# Patient Record
Sex: Female | Born: 1960 | Race: White | Hispanic: No | Marital: Married | Smoking: Never smoker
Health system: Southern US, Community
[De-identification: ages and names within clinical notes are randomized; demographics above are authoritative.]

## PROBLEM LIST (undated history)

## (undated) DIAGNOSIS — M069 Rheumatoid arthritis, unspecified: Secondary | ICD-10-CM

## (undated) DIAGNOSIS — M79671 Pain in right foot: Secondary | ICD-10-CM

## (undated) DIAGNOSIS — R7303 Prediabetes: Secondary | ICD-10-CM

## (undated) DIAGNOSIS — T7840XA Allergy, unspecified, initial encounter: Secondary | ICD-10-CM

## (undated) DIAGNOSIS — I10 Essential (primary) hypertension: Secondary | ICD-10-CM

## (undated) DIAGNOSIS — M79672 Pain in left foot: Secondary | ICD-10-CM

## (undated) DIAGNOSIS — E7800 Pure hypercholesterolemia, unspecified: Secondary | ICD-10-CM

## (undated) HISTORY — PX: REPAIR OF COMPLEX TRACTION RETINAL DETACHMENT: SHX6217

## (undated) HISTORY — DX: Rheumatoid arthritis, unspecified: M06.9

## (undated) HISTORY — DX: Essential (primary) hypertension: I10

## (undated) HISTORY — DX: Pain in right foot: M79.671

## (undated) HISTORY — DX: Prediabetes: R73.03

## (undated) HISTORY — DX: Pain in right foot: M79.672

## (undated) HISTORY — DX: Allergy, unspecified, initial encounter: T78.40XA

## (undated) HISTORY — DX: Pure hypercholesterolemia, unspecified: E78.00

---

## 2012-03-11 HISTORY — PX: COLONOSCOPY: SHX174

## 2012-12-12 ENCOUNTER — Encounter: Payer: Self-pay | Admitting: Obstetrics and Gynecology

## 2012-12-12 DIAGNOSIS — I10 Essential (primary) hypertension: Secondary | ICD-10-CM | POA: Insufficient documentation

## 2012-12-12 DIAGNOSIS — E7800 Pure hypercholesterolemia, unspecified: Secondary | ICD-10-CM | POA: Insufficient documentation

## 2012-12-13 ENCOUNTER — Ambulatory Visit (INDEPENDENT_AMBULATORY_CARE_PROVIDER_SITE_OTHER): Payer: BC Managed Care – PPO | Admitting: Obstetrics and Gynecology

## 2012-12-13 ENCOUNTER — Encounter: Payer: Self-pay | Admitting: Obstetrics and Gynecology

## 2012-12-13 VITALS — BP 138/80 | HR 82 | Resp 20 | Ht 64.5 in | Wt 188.0 lb

## 2012-12-13 DIAGNOSIS — Z01419 Encounter for gynecological examination (general) (routine) without abnormal findings: Secondary | ICD-10-CM

## 2012-12-13 NOTE — Patient Instructions (Signed)

## 2012-12-13 NOTE — Progress Notes (Signed)
52 y.o.   Married    Caucasian   female   G0P0   here for annual exam.  Frustrated with foot pain and having trouble with achilles tendon.  No menses since Dec 2013, then started July 25th, a regular period.    Patient's last menstrual period was 12/02/2012.          Sexually active: yes  The current method of family planning is none.    Exercising: not now( foot problems) Last mammogram:  01/13/2012 neg Last pap smear: 11/15/10 neg History of abnormal pap: no Smoking: no Alcohol: no Last colonoscopy:03/2012 (2) polyps, repeat in 3 years Last Bone Density:  never Last tetanus shot:2012 Last cholesterol check: 2013 total 201  Hgb:    pcp            Urine: pcp   Family History  Problem Relation Age of Onset  . Diabetes Mother   . Hypertension Mother   . Depression Mother   . Diabetes Father   . Multiple myeloma Father     Patient Active Problem List   Diagnosis Date Noted  . Hypertension 12/12/2012  . Elevated cholesterol 12/12/2012    Past Medical History  Diagnosis Date  . Hypertension   . Hypercholesteremia   . Foot pain, bilateral     Past Surgical History  Procedure Laterality Date  . Colonoscopy  03/2012    (2) polyps    Allergies: Zithromax  Current Outpatient Prescriptions  Medication Sig Dispense Refill  . Cholecalciferol (VITAMIN D) 2000 UNITS tablet Take 2,000 Units by mouth daily.      . Cinnamon 500 MG capsule Take 500 mg by mouth 2 (two) times daily.      . magnesium gluconate (MAGONATE) 500 MG tablet Take 500 mg by mouth daily.      . Omega-3 Fatty Acids (OMEGA-3 FISH OIL PO) Take by mouth.      . Plant Sterols and Stanols 450 MG TABS Take by mouth 2 (two) times daily.       No current facility-administered medications for this visit.    ROS: Pertinent items are noted in HPI.  Social Hx:  Married, no children, not employed  Exam:    BP 138/80  Pulse 82  Resp 20  Ht 5' 4.5" (1.638 m)  Wt 188 lb (85.276 kg)  BMI 31.78 kg/m2  LMP  07/25/2014Wt up 8 pounds, ht stable from last year   Wt Readings from Last 3 Encounters:  12/13/12 188 lb (85.276 kg)     Ht Readings from Last 3 Encounters:  12/13/12 5' 4.5" (1.638 m)    General appearance: alert, cooperative and appears stated age Head: Normocephalic, without obvious abnormality, atraumatic Neck: no adenopathy, supple, symmetrical, trachea midline and thyroid not enlarged, symmetric, no tenderness/mass/nodules Lungs: clear to auscultation bilaterally Breasts: Inspection negative, No nipple retraction or dimpling, No nipple discharge or bleeding, No axillary or supraclavicular adenopathy, Normal to palpation without dominant masses Heart: regular rate and rhythm Abdomen: soft, non-tender; bowel sounds normal; no masses,  no organomegaly Extremities: extremities normal, atraumatic, no cyanosis or edema Skin: Skin color, texture, turgor normal. No rashes or lesions Lymph nodes: Cervical, supraclavicular, and axillary nodes normal. No abnormal inguinal nodes palpated Neurologic: Grossly normal   Pelvic: External genitalia:  no lesions              Urethra:  normal appearing urethra with no masses, tenderness or lesions  Bartholins and Skenes: normal                 Vagina: normal appearing vagina with normal color and discharge, no lesions              Cervix: normal appearance              Pap taken: no        Bimanual Exam:  Uterus:  uterus is normal size, shape, consistency and nontender                                      Adnexa: normal adnexa in size, nontender and no masses                                      Rectovaginal: Confirms                                      Anus:  normal sphincter tone, no lesions  A: normal perimenopausal exam, natural family planning     HTN     P:     mammogram counseled on breast self exam, mammography screening, adequate intake of calcium and vitamin D, diet and exercise return annually or prn     An After  Visit Summary was printed and given to the patient.

## 2013-02-23 DIAGNOSIS — M064 Inflammatory polyarthropathy: Secondary | ICD-10-CM | POA: Insufficient documentation

## 2013-02-23 DIAGNOSIS — M1990 Unspecified osteoarthritis, unspecified site: Secondary | ICD-10-CM | POA: Insufficient documentation

## 2013-02-23 DIAGNOSIS — M069 Rheumatoid arthritis, unspecified: Secondary | ICD-10-CM | POA: Insufficient documentation

## 2013-03-09 DIAGNOSIS — R768 Other specified abnormal immunological findings in serum: Secondary | ICD-10-CM

## 2013-03-09 HISTORY — DX: Other specified abnormal immunological findings in serum: R76.8

## 2013-12-14 ENCOUNTER — Encounter: Payer: Self-pay | Admitting: Obstetrics and Gynecology

## 2013-12-14 ENCOUNTER — Ambulatory Visit (INDEPENDENT_AMBULATORY_CARE_PROVIDER_SITE_OTHER): Payer: BC Managed Care – PPO | Admitting: Obstetrics and Gynecology

## 2013-12-14 VITALS — BP 130/88 | HR 80 | Resp 16 | Ht 64.5 in | Wt 184.2 lb

## 2013-12-14 DIAGNOSIS — Z01419 Encounter for gynecological examination (general) (routine) without abnormal findings: Secondary | ICD-10-CM

## 2013-12-14 DIAGNOSIS — D229 Melanocytic nevi, unspecified: Secondary | ICD-10-CM

## 2013-12-14 DIAGNOSIS — D239 Other benign neoplasm of skin, unspecified: Secondary | ICD-10-CM

## 2013-12-14 NOTE — Progress Notes (Signed)
Patient ID: Julie [name], female   DOB: 1961/01/19, 53 y.o.   MRN: [id number] GYNECOLOGY VISIT  PCP:   Horald Pollen, MD  Referring provider:   HPI: 53 y.o.   Married  Caucasian  female   G0P0 with Patient's last menstrual period was 12/02/2012.   here for  AEX.  Menses stopped one year ago.  Menopausal symptoms have decreased.  No vaginal dryness.   Has rheumatoid arthritis. Sees Adriana Simas.   Hgb:    PCP Urine:  PCP  GYNECOLOGIC HISTORY: Patient's last menstrual period was 12/02/2012. Sexually active:  yes Partner preference: female Contraception:  postmenopausal  Menopausal hormone therapy: no DES exposure:  no  Blood transfusions:  no  Sexually transmitted diseases:  no  GYN procedures and prior surgeries:  no Last mammogram:  02-01-13 [id number]              Last pap and high risk HPV testing:  11-15-10 wnl  History of abnormal pap smear:  no   OB History   Grav Para Term Preterm Abortions TAB SAB Ect Mult Living   0 0               LIFESTYLE: Exercise:  yoga             Tobacco: no Alcohol:  no Drug use:  no  OTHER HEALTH MAINTENANCE: Tetanus/TDap:  2012 Gardisil:            n/a Influenza:        02/2013 Zostavax:     n/a  Bone density:   --- Colonoscopy:   03/2012 revealed 2 polyps with Dr. Tora Duck in [place].  Next colonoscopy due 03/2015.  Cholesterol check: 201 with PCP  Family History  Problem Relation Age of Onset  . Diabetes Mother   . Hypertension Mother   . Depression Mother   . Diabetes Father   . Multiple myeloma Father     Patient Active Problem List   Diagnosis Date Noted  . Hypertension 12/12/2012  . Elevated cholesterol 12/12/2012   Past Medical History  Diagnosis Date  . Hypertension   . Hypercholesteremia   . Foot pain, bilateral   . Rheumatoid arthritis     Past Surgical History  Procedure Laterality Date  . Colonoscopy  03/2012    (2) polyps     ALLERGIES: Zithromax  Current Outpatient Prescriptions  Medication Sig Dispense Refill  . Alpha-Lipoic Acid 600 MG CAPS Take 600 mg by mouth 2 (two) times daily.      . Ascorbic Acid (VITAMIN C) 1000 MG tablet Take 1,000 mg by mouth 2 (two) times daily.      Marland Kitchen aspirin EC 81 MG tablet Take 81 mg by mouth daily.      . beta carotene 25000 UNIT capsule Take 25,000 Units by mouth daily.      . Cholecalciferol (VITAMIN D3) 1000 UNITS CAPS Take 1,000 Units by mouth daily. Takes 6000 units daily      . diclofenac (VOLTAREN) 75 MG EC tablet Take 75 mg by mouth 2 (two) times daily.      . hydroxychloroquine (PLAQUENIL) 200 MG tablet Take 200 mg by mouth daily. TAKE ONE TABLET BY MOUTH  DAILY      . lactobacillus acidophilus (BACID) TABS tablet Take 2 tablets by mouth daily.      Marland Kitchen liothyronine (CYTOMEL) 5 MCG tablet Take 5 mcg by mouth 2 (two) times daily.      Marland Kitchen  lisinopril (PRINIVIL,ZESTRIL) 5 MG tablet Take 5 mg by mouth.      Marland Kitchen MAGNESIUM GLYCINATE PLUS PO Take 200 mg by mouth 2 (two) times daily.      . Methylcobalamin (METHYL B-12 PO) Take 5,000 mcg by mouth daily.      . minocycline (MINOCIN,DYNACIN) 50 MG capsule Take 50 mg by mouth daily. Takes 114m Mon, Wed, Fri      . Omega-3 Fatty Acids (OMEGA-3 FISH OIL PO) Take by mouth.      . Psyllium (FIBER) 0.52 G CAPS Take 1 packet by mouth daily.       No current facility-administered medications for this visit.     ROS:  Pertinent items are noted in HPI.  SOCIAL HISTORY:  homemaker  PHYSICAL EXAMINATION:    BP 130/88  Pulse 80  Resp 16  Ht 5' 4.5" (1.638 m)  Wt 184 lb 3.2 oz (83.553 kg)  BMI 31.14 kg/m2  LMP 12/02/2012   Wt Readings from Last 3 Encounters:  12/14/13 184 lb 3.2 oz (83.553 kg)  12/13/12 188 lb (85.276 kg)     Ht Readings from Last 3 Encounters:  12/14/13 5' 4.5" (1.638 m)  12/13/12 5' 4.5" (1.638 m)    General appearance: alert, cooperative and appears stated age Head: Normocephalic, without obvious  abnormality, atraumatic Neck: no adenopathy, supple, symmetrical, trachea midline and thyroid not enlarged, symmetric, no tenderness/mass/nodules Lungs: clear to auscultation bilaterally Breasts: Inspection negative, No nipple retraction or dimpling, No nipple discharge or bleeding, No axillary or supraclavicular adenopathy, Normal to palpation without dominant masses Heart: regular rate and rhythm Abdomen: soft, non-tender; no masses,  no organomegaly Extremities: extremities normal, atraumatic, no cyanosis or edema Skin: Skin color, texture, turgor normal. No rashes.  Multiple darkly pigmented nevi. (States no change.) Lymph nodes: Cervical, supraclavicular, and axillary nodes normal. No abnormal inguinal nodes palpated Neurologic: Grossly normal  Pelvic: External genitalia:  no lesions              Urethra:  normal appearing urethra with no masses, tenderness or lesions              Bartholins and Skenes: normal                 Vagina: normal appearing vagina with normal color and discharge, no lesions              Cervix: normal appearance              Pap and high risk HPV testing done: Yes.  .            Bimanual Exam:  Uterus:  uterus is normal size, shape, consistency and nontender                                      Adnexa: normal adnexa in size, nontender and no masses                                      Rectovaginal: Confirms                                      Anus:  normal sphincter tone, left perianal 3 mm raised and nonpigmented skin - skin tag.  ASSESSMENT  Normal gynecologic exam. Rheumatoid arthritis.  Darkly pigmented nevi.   PLAN  Mammogram recommended yearly.  Pap smear and high risk HPV testing performed. Counseled on self breast exam, Calcium and vitamin D intake, exercise. I recommend a dermatology visit.  I gave the patient several names and she will consider them and make an appointment.  Return annually or prn   An After Visit Summary was printed and  given to the patient.

## 2013-12-14 NOTE — Patient Instructions (Signed)

## 2013-12-15 DIAGNOSIS — D229 Melanocytic nevi, unspecified: Secondary | ICD-10-CM | POA: Insufficient documentation

## 2013-12-19 LAB — IPS PAP TEST WITH HPV

## 2013-12-27 ENCOUNTER — Encounter: Payer: Self-pay | Admitting: Family Medicine

## 2013-12-27 ENCOUNTER — Ambulatory Visit (INDEPENDENT_AMBULATORY_CARE_PROVIDER_SITE_OTHER): Payer: BC Managed Care – PPO | Admitting: Family Medicine

## 2013-12-27 ENCOUNTER — Other Ambulatory Visit (INDEPENDENT_AMBULATORY_CARE_PROVIDER_SITE_OTHER): Payer: BC Managed Care – PPO

## 2013-12-27 VITALS — BP 130/84 | HR 77 | Ht 64.5 in | Wt 185.0 lb

## 2013-12-27 DIAGNOSIS — M25569 Pain in unspecified knee: Secondary | ICD-10-CM

## 2013-12-27 DIAGNOSIS — M6580 Other synovitis and tenosynovitis, unspecified site: Secondary | ICD-10-CM

## 2013-12-27 DIAGNOSIS — M25562 Pain in left knee: Secondary | ICD-10-CM

## 2013-12-27 DIAGNOSIS — M659 Synovitis and tenosynovitis, unspecified: Secondary | ICD-10-CM | POA: Insufficient documentation

## 2013-12-27 NOTE — Progress Notes (Signed)
[facility] [place], [place] [address] Phone: [phone] Subjective:    I'm seeing this patient by the request  of:  Vaughn M.D.   CC: Left knee pain [id number] Julie [name] is a 53 y.o. female coming in with complaint of left knee pain. Patient states that she has had this pain for multiple months but seems to be intermittent. Patient states with a lot of standing as well as with a lot of sitting she can have some discomfort that's mostly in the posterior aspect of the knee. Denies any radiation down her legs or any numbness. Patient has tried some over-the-counter medications with minimal benefit. Patient is also taking diclofenac on a regular basis with no significant improvement. Patient does have a past medical history significant for rheumatoid arthritis and feels that this is contributing to the pain. Patient states that she is no longer walking but this is due to her knee pain as well as bilateral foot pain. Her knee does not wake her up at night. States that the severity is 4/10     Past medical history, social, surgical and family history all reviewed in electronic medical record.   Review of Systems: No headache, visual changes, nausea, vomiting, diarrhea, constipation, dizziness, abdominal pain, skin rash, fevers, chills, night sweats, weight loss, swollen lymph nodes, body aches, joint swelling, muscle aches, chest pain, shortness of breath, mood changes.   Objective Blood pressure 130/84, pulse 77, height 5' 4.5" (1.638 m), weight 185 lb (83.915 kg), last menstrual period 12/02/2012, SpO2 96.00%.  General: No apparent distress alert and oriented x3 mood and affect normal, dressed appropriately.  HEENT: Pupils equal, extraocular movements intact  Respiratory: Patient's speak in full sentences and does not appear short of breath  Cardiovascular: No lower extremity edema, non tender, no erythema  Skin: Warm dry intact with no  signs of infection or rash on extremities or on axial skeleton.  Abdomen: Soft nontender  Neuro: Cranial nerves II through XII are intact, neurovascularly intact in all extremities with 2+ DTRs and 2+ pulses.  Lymph: No lymphadenopathy of posterior or anterior cervical chain or axillae bilaterally.  Gait normal with good balance and coordination.  MSK:  Non tender with full range of motion and good stability and symmetric strength and tone of shoulders, elbows, wrist, hip, and ankles bilaterally.  Knee: Left Normal to inspection with no erythema or effusion or obvious bony abnormalities. Mild fullness noted in the popliteal fossa Minimal tenderness to palpation over the medial joint line ROM shows patient lacks the last 2 of extension as well as flexion Ligaments with solid consistent endpoints including ACL, PCL, LCL, MCL. Positive Mcmurray's, Apley's, and Thessalonian tests. Non painful patellar compression. Patellar glide with moderate crepitus. Patellar and quadriceps tendons unremarkable. Hamstring and quadriceps strength is normal.   MSK US performed of: Left knee This study was ordered, performed, and interpreted by Charlann Boxer D.O.  Knee: All structures visualized. Patient suprapatellar pouch do show that patient does have a fairly severe synovitis with mild effusion. Rheumatoid nodule noted. Anteromedial meniscus does have some degenerative changes but no acute tear noted. No displacement noted. Anterolateral, posteromedial, and posterolateral menisci unremarkable without tearing, fraying, effusion, or displacement. Patellar Tendon unremarkable on long and transverse views without effusion. No abnormality of prepatellar bursa. LCL and MCL unremarkable on long and transverse views. No abnormality of origin of medial or lateral head of the gastrocnemius. Baker cyst noted but mostly coagulated at this time.  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

## 2013-12-27 NOTE — Assessment & Plan Note (Signed)
Patient does have synovitis as well as a Baker's cyst that's mostly made synovial material. We discussed different treatment options at this time the patient was at significantly against a steroid injection. Patient was given a brace was fitted by me as well as a compression sleeve that I think will be beneficial. We discussed icing as well as home exercise program. We discussed the possibility of responding to certain over-the-counter medications. Patient will try a topical anti-inflammatory and can cause if she enjoys or feels that is helping. Patient and will come back again in 3-4 weeks for further evaluation and treatment.

## 2013-12-27 NOTE — Patient Instructions (Signed)
Good to meet you Ice 20 minutes 2 times daily. Usually after activity and before bed. Exercises 3 times a week.  Wear brace with a lot of activity and compression with a lot of sitting.  Shoes- rigid sole shoes. Bronwen Betters, Dansko and New balance greater then 700 Would recommend the vitamin D turmeric and consider glucosamine.  Come back in 4-6 weeks to see how you are doing.

## 2014-02-07 ENCOUNTER — Encounter: Payer: Self-pay | Admitting: Family Medicine

## 2014-02-07 ENCOUNTER — Ambulatory Visit (INDEPENDENT_AMBULATORY_CARE_PROVIDER_SITE_OTHER): Payer: BC Managed Care – PPO | Admitting: Family Medicine

## 2014-02-07 VITALS — BP 170/110 | HR 72 | Temp 98.4°F | Wt 178.0 lb

## 2014-02-07 DIAGNOSIS — M6580 Other synovitis and tenosynovitis, unspecified site: Secondary | ICD-10-CM

## 2014-02-07 DIAGNOSIS — M659 Synovitis and tenosynovitis, unspecified: Secondary | ICD-10-CM

## 2014-02-07 MED ORDER — DICLOFENAC SODIUM 2 % TD SOLN: 2.0000 "application " | Freq: Two times a day (BID) | TRANSDERMAL | Status: DC

## 2014-02-07 NOTE — Progress Notes (Signed)
  [facility] [place],  [address] Phone: [phone] Subjective:     CC: Left knee pain follow up [id number] Julie [name] is a 53 y.o. female coming in with complaint of left knee pain. Patient was found to have a synovitis. Patient does have a past medical history significant for rheumatoid arthritis. Patient elected to try conservative therapy with over-the-counter medications as well as naproxen on an as-needed basis. Patient continues to wear the brace as well as do the exercises fairly regularly. Patient states that she would say she is approximately 80-85% better. Denies any radiation down the legs any numbness or tingling. Overall think she is doing fairly well. Still has some days when they can give her some discomfort but denies any locking or giving out on her.     Past medical history, social, surgical and family history all reviewed in electronic medical record.   Review of Systems: No headache, visual changes, nausea, vomiting, diarrhea, constipation, dizziness, abdominal pain, skin rash, fevers, chills, night sweats, weight loss, swollen lymph nodes, body aches, joint swelling, muscle aches, chest pain, shortness of breath, mood changes.   Objective Blood pressure 170/110, pulse 72, temperature 98.4 F (36.9 C), temperature source Oral, weight 178 lb (80.74 kg), SpO2 98.00%.  General: No apparent distress alert and oriented x3 mood and affect normal, dressed appropriately.  HEENT: Pupils equal, extraocular movements intact  Respiratory: Patient's speak in full sentences and does not appear short of breath  Cardiovascular: No lower extremity edema, non tender, no erythema  Skin: Warm dry intact with no signs of infection or rash on extremities or on axial skeleton.  Abdomen: Soft nontender  Neuro: Cranial nerves II through XII are intact, neurovascularly intact in all extremities with 2+ DTRs and 2+ pulses.  Lymph: No  lymphadenopathy of posterior or anterior cervical chain or axillae bilaterally.  Gait normal with good balance and coordination.  MSK:  Non tender with full range of motion and good stability and symmetric strength and tone of shoulders, elbows, wrist, hip, and ankles bilaterally.  Knee: Left Normal to inspection with no erythema or effusion or obvious bony abnormalities. Less swelling noted than previous exam in the popliteal fossa Minimal tenderness to palpation over the medial joint line Near complete range of motion Ligaments with solid consistent endpoints including ACL, PCL, LCL, MCL. Positive Mcmurray's, Apley's, and Thessalonian tests. Non painful patellar compression. Patellar glide with moderate crepitus. Patellar and quadriceps tendons unremarkable. Hamstring and quadriceps strength is normal.   MSK US performed of: Left knee This study was ordered, performed, and interpreted by Charlann Boxer D.O.  Knee: All structures visualized. Mild synovitis still noted Anteromedial meniscus degenerative changes No displacement noted. Anterolateral, posteromedial, and posterolateral menisci unremarkable without tearing, fraying, effusion, or displacement. Patellar Tendon unremarkable on long and transverse views without effusion. No abnormality of prepatellar bursa. LCL and MCL unremarkable on long and transverse views. No abnormality of origin of medial or lateral head of the gastrocnemius. Baker cyst noted but mostly coagulated at this time.  IMPRESSION: Synovitis with improvement and continued Baker cyst      Impression and Recommendations:     This case required medical decision making of moderate complexity.

## 2014-02-07 NOTE — Patient Instructions (Addendum)
You are doing amazing.  Continue to decrease the brace when you want.  Ice is your friend when you need it.,  Try the pennsaid twice daily on the knee and any other place that aches. Call them if you have not heard in a week.  Continue the exercises 2-3 times a week.  See me again in 6 weeks if not perfect.

## 2014-02-07 NOTE — Assessment & Plan Note (Signed)
Patient has made some improvement this with the over-the-counter medications. Patient was given a trial of topical anti-inflammatories and will see if this will be beneficial. Encourage patient to continue to wear the brace and when needed. Discussed icing protocol. I do feel this is likely secondary to patient's rheumatoid arthritis we may need to consider further chronic treatment options. Patient to will continue to do what she is doing because she states that she is having subjective improvement. Patient will follow up with me again in 4-6 weeks for further evaluation.  Spent greater than 25 minutes with patient face-to-face and had greater than 50% of counseling including as described above in assessment and plan.

## 2014-02-07 NOTE — Progress Notes (Signed)
Pre visit review using our clinic review tool, if applicable. No additional management support is needed unless otherwise documented below in the visit note. 

## 2014-03-21 ENCOUNTER — Ambulatory Visit: Payer: BC Managed Care – PPO | Admitting: Family Medicine

## 2014-12-21 ENCOUNTER — Ambulatory Visit: Payer: BC Managed Care – PPO | Admitting: Obstetrics and Gynecology

## 2014-12-26 ENCOUNTER — Encounter: Payer: Self-pay | Admitting: Obstetrics and Gynecology

## 2014-12-26 ENCOUNTER — Ambulatory Visit (INDEPENDENT_AMBULATORY_CARE_PROVIDER_SITE_OTHER): Payer: BLUE CROSS/BLUE SHIELD | Admitting: Obstetrics and Gynecology

## 2014-12-26 VITALS — BP 136/84 | HR 76 | Resp 18 | Ht 64.25 in | Wt 169.6 lb

## 2014-12-26 DIAGNOSIS — Z0000 Encounter for general adult medical examination without abnormal findings: Secondary | ICD-10-CM

## 2014-12-26 DIAGNOSIS — Z01419 Encounter for gynecological examination (general) (routine) without abnormal findings: Secondary | ICD-10-CM

## 2014-12-26 LAB — POCT URINALYSIS DIPSTICK
BILIRUBIN UA: NEGATIVE
Blood, UA: NEGATIVE
Glucose, UA: NEGATIVE
Ketones, UA: NEGATIVE
Leukocytes, UA: NEGATIVE
NITRITE UA: NEGATIVE
PH UA: 5
Protein, UA: NEGATIVE
Urobilinogen, UA: NEGATIVE

## 2014-12-26 NOTE — Progress Notes (Signed)
Patient ID: Julie [name], female   DOB: [birth year], 54 y.o.   MRN: [id number] 54 y.o. G0P0 Married Caucasian female here for annual exam.    Doing OK with menopause.  Hot flashes are random but manageable. Some increase in symptoms this summer with the heat.  Declines any therapy.   Denies any vaginal bleeding or  spotting.   Rheumatoid arthritis is under good control.   Did not see dermatology last year. Has had moles removed in the past which "were not anything" per patient.  Does not remember name of provider.   PCP:  Lollie Sails, MD   Patient's last menstrual period was 12/02/2012 (approximate).          Sexually active: Yes.   female The current method of family planning is post menopausal status.    Exercising: Yes.    walk 2.22mles 4 times/week, yoga twice weekly. Smoker:  no  Health Maintenance: Pap:  12-14-13 Neg:Neg HR HPV History of abnormal Pap:  no MMG:  02-05-14 [facility] poly[facility] with Dr WTora Duckin [place]  Due this year.  Patient will call to schedule. BMD:   n/a  Result  n/a TDaP:  2012 Screening Labs:  Hb today: PCP, Urine today: Neg   reports that she has never smoked. She has never used smokeless tobacco. She reports that she does not drink alcohol or use illicit drugs.  Past Medical History  Diagnosis Date  . Hypertension   . Hypercholesteremia   . Foot pain, bilateral   . Rheumatoid arthritis     Past Surgical History  Procedure Laterality Date  . Colonoscopy  03/2012    (2) polyps    Current Outpatient Prescriptions  Medication Sig Dispense Refill  . Alpha-Lipoic Acid 600 MG CAPS Take 600 mg by mouth 2 (two) times daily.    . Ascorbic Acid (VITAMIN C) 1000 MG tablet Take 1,000 mg by mouth 2 (two) times daily.    . beta carotene 25000 UNIT capsule Take 25,000 Units by mouth daily.    . Cholecalciferol (VITAMIN D3) 1000 UNITS CAPS Take 1,000 Units by mouth daily.  Takes 6000 units daily    . diclofenac (VOLTAREN) 75 MG EC tablet Take 1 tablet by mouth. Takes 1 tablet every 3-5 days  0  . lactobacillus acidophilus (BACID) TABS tablet Take 2 tablets by mouth daily.    .Marland Kitchenliothyronine (CYTOMEL) 5 MCG tablet Take 5 mcg by mouth 2 (two) times daily. 20 mcg in the AM and 10 mcg in the PM    . MAGNESIUM GLYCINATE PLUS PO Take 200 mg by mouth 2 (two) times daily.    . Methylcobalamin (METHYL B-12 PO) Take 5,000 mcg by mouth daily.    .Marland KitchenMILK THISTLE PO Take 600 mg by mouth daily.    . minocycline (MINOCIN,DYNACIN) 50 MG capsule Take 50 mg by mouth daily. Takes 1066mMon, Wed, Fri    . Omega-3 Fatty Acids (OMEGA-3 FISH OIL PO) Take by mouth.    . Psyllium (FIBER) 0.52 G CAPS Take 1 packet by mouth daily.     No current facility-administered medications for this visit.    Family History  Problem Relation Age of Onset  . Diabetes Mother   . Hypertension Mother   . Depression Mother   . Diabetes Father   . Multiple myeloma Father     ROS:  Pertinent items are noted in HPI.  Otherwise, a comprehensive ROS was negative.  Exam:  BP 136/84 mmHg  Pulse 76  Resp 18  Ht 5' 4.25" (1.632 m)  Wt 169 lb 9.6 oz (76.93 kg)  BMI 28.88 kg/m2  LMP 12/02/2012 (Approximate)    General appearance: alert, cooperative and appears stated age Head: Normocephalic, without obvious abnormality, atraumatic Neck: no adenopathy, supple, symmetrical, trachea midline and thyroid normal to inspection and palpation Lungs: clear to auscultation bilaterally Breasts: normal appearance, no masses or tenderness, Inspection negative, No nipple retraction or dimpling, No nipple discharge or bleeding, No axillary or supraclavicular adenopathy Heart: regular rate and rhythm Abdomen: soft, non-tender; bowel sounds normal; no masses,  no organomegaly Extremities: extremities normal, atraumatic, no cyanosis or edema Skin: Skin color, texture, turgor normal. Multiple pigmented nevi.  4 mm dark  brownish black area of right mid abdomen.   Lymph nodes: Cervical, supraclavicular, and axillary nodes normal. No abnormal inguinal nodes palpated Neurologic: Grossly normal  Pelvic: External genitalia:  no lesions              Urethra:  normal appearing urethra with no masses, tenderness or lesions              Bartholins and Skenes: normal                 Vagina: normal appearing vagina with normal color and discharge, no lesions              Cervix: 4 mm area of erythema of cervix at 10:00.  Pap taken here.  Brush also used on cervical os which is stenotic.  (has had recent intercourse).              Pap taken: Yes.   Bimanual Exam:  Uterus:  normal size, contour, position, consistency, mobility, non-tender              Adnexa: normal adnexa and no mass, fullness, tenderness              Rectovaginal: Yes.  .  Confirms.              Anus:  normal sphincter tone, no lesions  Chaperone was present for exam.  Assessment:   Well woman visit with normal exam. Pigmented nevus.  Atrophy of vagina.  Area of cervix is probably atrophy change as a result of intercourse.   Plan: Yearly mammogram recommended after age 37.  She will schedule. Recommended self breast exam.  Pap and HR HPV as above. Discussed Calcium, Vitamin D, regular exercise program including cardiovascular and weight bearing exercise. Labs performed.  No..  Refills given on medications.  No..    Discussed vaginal atrophy and options for treatment - water based, cooking oils, vaginal estrogens.  No Rx at this time.  Patient states she will take responsibility for making an appointment with dermatologist.  She wants to choose her provider.  She understands I would like this appointment to rule out dysplasia or cancer.  She will call back if she cannot get an appointment.  Follow up annually and prn.     After visit summary provided.

## 2014-12-26 NOTE — Patient Instructions (Signed)

## 2014-12-29 LAB — IPS PAP TEST WITH HPV

## 2016-01-09 ENCOUNTER — Ambulatory Visit: Payer: BLUE CROSS/BLUE SHIELD | Admitting: Obstetrics and Gynecology

## 2016-01-10 ENCOUNTER — Ambulatory Visit: Payer: BLUE CROSS/BLUE SHIELD | Admitting: Certified Nurse Midwife

## 2016-01-29 ENCOUNTER — Ambulatory Visit (INDEPENDENT_AMBULATORY_CARE_PROVIDER_SITE_OTHER): Payer: BLUE CROSS/BLUE SHIELD | Admitting: Certified Nurse Midwife

## 2016-01-29 ENCOUNTER — Encounter: Payer: Self-pay | Admitting: Certified Nurse Midwife

## 2016-01-29 VITALS — BP 120/78 | HR 72 | Resp 16 | Ht 64.25 in | Wt 184.0 lb

## 2016-01-29 DIAGNOSIS — Z01419 Encounter for gynecological examination (general) (routine) without abnormal findings: Secondary | ICD-10-CM

## 2016-01-29 NOTE — Patient Instructions (Signed)
EXERCISE AND DIET:  We recommended that you start or continue a regular exercise program for good health. Regular exercise means any activity that makes your heart beat faster and makes you sweat.  We recommend exercising at least 30 minutes per day at least 3 days a week, preferably 4 or 5.  We also recommend a diet low in fat and sugar.  Inactivity, poor dietary choices and obesity can cause diabetes, heart attack, stroke, and kidney damage, among others.    ALCOHOL AND SMOKING:  Women should limit their alcohol intake to no more than 7 drinks/beers/glasses of wine (combined, not each!) per week. Moderation of alcohol intake to this level decreases your risk of breast cancer and liver damage. And of course, no recreational drugs are part of a healthy lifestyle.  And absolutely no smoking or even second hand smoke. Most people know smoking can cause heart and lung diseases, but did you know it also contributes to weakening of your bones? Aging of your skin?  Yellowing of your teeth and nails?  CALCIUM AND VITAMIN D:  Adequate intake of calcium and Vitamin D are recommended.  The recommendations for exact amounts of these supplements seem to change often, but generally speaking 600 mg of calcium (either carbonate or citrate) and 800 units of Vitamin D per day seems prudent. Certain women may benefit from higher intake of Vitamin D.  If you are among these women, your doctor will have told you during your visit.    PAP SMEARS:  Pap smears, to check for cervical cancer or precancers,  have traditionally been done yearly, although recent scientific advances have shown that most women can have pap smears less often.  However, every woman still should have a physical exam from her gynecologist every year. It will include a breast check, inspection of the vulva and vagina to check for abnormal growths or skin changes, a visual exam of the cervix, and then an exam to evaluate the size and shape of the uterus and  ovaries.  And after 57 years of age, a rectal exam is indicated to check for rectal cancers. We will also provide age appropriate advice regarding health maintenance, like when you should have certain vaccines, screening for sexually transmitted diseases, bone density testing, colonoscopy, mammograms, etc.   MAMMOGRAMS:  All women over 40 years old should have a yearly mammogram. Many facilities now offer a "3D" mammogram, which may cost around $50 extra out of pocket. If possible,  we recommend you accept the option to have the 3D mammogram performed.  It both reduces the number of women who will be called back for extra views which then turn out to be normal, and it is better than the routine mammogram at detecting truly abnormal areas.    COLONOSCOPY:  Colonoscopy to screen for colon cancer is recommended for all women at age 50.  We know, you hate the idea of the prep.  We agree, BUT, having colon cancer and not knowing it is worse!!  Colon cancer so often starts as a polyp that can be seen and removed at colonscopy, which can quite literally save your life!  And if your first colonoscopy is normal and you have no family history of colon cancer, most women don't have to have it again for 10 years.  Once every ten years, you can do something that may end up saving your life, right?  We will be happy to help you get it scheduled when you are ready.    Be sure to check your insurance coverage so you understand how much it will cost.  It may be covered as a preventative service at no cost, but you should check your particular policy.      Breast Self-Awareness Practicing breast self-awareness may pick up problems early, prevent significant medical complications, and possibly save your life. By practicing breast self-awareness, you can become familiar with how your breasts look and feel and if your breasts are changing. This allows you to notice changes early. It can also offer you some reassurance that your  breast health is good. One way to learn what is normal for your breasts and whether your breasts are changing is to do a breast self-exam. If you find a lump or something that was not present in the past, it is best to contact your caregiver right away. Other findings that should be evaluated by your caregiver include nipple discharge, especially if it is bloody; skin changes or reddening; areas where the skin seems to be pulled in (retracted); or new lumps and bumps. Breast pain is seldom associated with cancer (malignancy), but should also be evaluated by a caregiver. HOW TO PERFORM A BREAST SELF-EXAM The best time to examine your breasts is 5-7 days after your menstrual period is over. During menstruation, the breasts are lumpier, and it may be more difficult to pick up changes. If you do not menstruate, have reached menopause, or had your uterus removed (hysterectomy), you should examine your breasts at regular intervals, such as monthly. If you are breastfeeding, examine your breasts after a feeding or after using a breast pump. Breast implants do not decrease the risk for lumps or tumors, so continue to perform breast self-exams as recommended. Talk to your caregiver about how to determine the difference between the implant and breast tissue. Also, talk about the amount of pressure you should use during the exam. Over time, you will become more familiar with the variations of your breasts and more comfortable with the exam. A breast self-exam requires you to remove all your clothes above the waist. 1. Look at your breasts and nipples. Stand in front of a mirror in a room with good lighting. With your hands on your hips, push your hands firmly downward. Look for a difference in shape, contour, and size from one breast to the other (asymmetry). Asymmetry includes puckers, dips, or bumps. Also, look for skin changes, such as reddened or scaly areas on the breasts. Look for nipple changes, such as discharge,  dimpling, repositioning, or redness. 2. Carefully feel your breasts. This is best done either in the shower or tub while using soapy water or when flat on your back. Place the arm (on the side of the breast you are examining) above your head. Use the pads (not the fingertips) of your three middle fingers on your opposite hand to feel your breasts. Start in the underarm area and use  inch (2 cm) overlapping circles to feel your breast. Use 3 different levels of pressure (light, medium, and firm pressure) at each circle before moving to the next circle. The light pressure is needed to feel the tissue closest to the skin. The medium pressure will help to feel breast tissue a little deeper, while the firm pressure is needed to feel the tissue close to the ribs. Continue the overlapping circles, moving downward over the breast until you feel your ribs below your breast. Then, move one finger-width towards the center of the body. Continue to use the    inch (2 cm) overlapping circles to feel your breast as you move slowly up toward the collar bone (clavicle) near the base of the neck. Continue the up and down exam using all 3 pressures until you reach the middle of the chest. Do this with each breast, carefully feeling for lumps or changes. 3.  Keep a written record with breast changes or normal findings for each breast. By writing this information down, you do not need to depend only on memory for size, tenderness, or location. Write down where you are in your menstrual cycle, if you are still menstruating. Breast tissue can have some lumps or thick tissue. However, see your caregiver if you find anything that concerns you.  SEEK MEDICAL CARE IF:  You see a change in shape, contour, or size of your breasts or nipples.   You see skin changes, such as reddened or scaly areas on the breasts or nipples.   You have an unusual discharge from your nipples.   You feel a new lump or unusually thick areas.     This information is not intended to replace advice given to you by your health care provider. Make sure you discuss any questions you have with your health care provider.   Document Released: 04/27/2005 Document Revised: 04/13/2012 Document Reviewed: 08/12/2011 Elsevier Interactive Patient Education 2016 [employer]  

## 2016-01-29 NOTE — Progress Notes (Signed)
55 y.o. G0P0 Married  Caucasian Fe here for annual exam. Menopausal no HRT. Denies vaginal bleeding or vaginal dryness. Has RA and stays active. Sees Dr Deirdre Pippins for aex, labs,hypertension,cholesterol and RA management. Occasional hot flash and tolerating well. Taking Boswellia for joint pain.( aware this can contribute to hot flash).Sees rheumatologist prn. Working on [employer] now. No other health issues today. Has 4 cats she is caring for!  Patient's last menstrual period was 12/02/2012 (approximate).          Sexually active: Yes.    The current method of family planning is post menopausal status.    Exercising: Yes.    walking & yoga Smoker:  yes  Health Maintenance: Pap: 12-26-14 neg HPV HR neg MMG: 02-05-14 category a density birads 1:neg Colonoscopy:  2016 polyps f/u 66yr per patient BMD:   none TDaP:  2012 Shingles: no Pneumonia: no Hep C and HIV: not done Labs: pcp Self breast exam: not done   reports that she has never smoked. She has never used smokeless tobacco. She reports that she does not drink alcohol or use drugs.  Past Medical History:  Diagnosis Date  . Foot pain, bilateral   . Hypercholesteremia   . Hypertension   . Rheumatoid arthritis ([facility]     Past Surgical History:  Procedure Laterality Date  . COLONOSCOPY  03/2012   (2) polyps    Current Outpatient Prescriptions  Medication Sig Dispense Refill  . Alpha-Lipoic Acid 600 MG CAPS Take 600 mg by mouth 2 (two) times daily.    . Ascorbic Acid (VITAMIN C PO) Take 2,000 mg by mouth 2 (two) times daily.    .Azucena FreedSerrata (BOSWELLIA PO) Take 800 mg by mouth 2 (two) times daily.    . Cholecalciferol (VITAMIN D PO) Take 10,000 Int'l Units by mouth daily.    . diclofenac (VOLTAREN) 75 MG EC tablet Take 1 tablet by mouth. Takes 1 tablet every 3-5 days  0  . hydroxychloroquine (PLAQUENIL) 200 MG tablet Take by mouth 2 (two) times daily.    . IRON PO Take 50 mg by mouth 2 (two) times daily.    .Marland Kitchen lactobacillus acidophilus (BACID) TABS tablet Take 2 tablets by mouth daily.    .Marland Kitchenliothyronine (CYTOMEL) 5 MCG tablet Take 5 mcg by mouth. 20 mcg in the AM and 15 mcg in the PM    . MAGNESIUM PO Take 400 mg by mouth. 5 days per week    . Methylcobalamin (METHYL B-12 PO) Take 5,000 mcg by mouth every other day.     .Marland KitchenMILK THISTLE PO Take 600 mg by mouth daily.    . minocycline (MINOCIN,DYNACIN) 50 MG capsule Take by mouth. Takes 1034mMon, Wed, Fri    . Omega-3 Fatty Acids (OMEGA-3 FISH OIL PO) Take by mouth.    . Psyllium (FIBER) 0.52 G CAPS Take 1 packet by mouth daily.    . Marland KitchenNABLE TO FIND Curcumin 200026mwice daily     No current facility-administered medications for this visit.     Family History  Problem Relation Age of Onset  . Diabetes Mother   . Hypertension Mother   . Depression Mother   . Diabetes Father   . Multiple myeloma Father     ROS:  Pertinent items are noted in HPI.  Otherwise, a comprehensive ROS was negative.  Exam:   BP 120/78   Pulse 72   Resp 16   Ht 5' 4.25" (1.632 m)   Wt  184 lb (83.5 kg)   LMP 12/02/2012 (Approximate)   BMI 31.34 kg/m  Height: 5' 4.25" (163.2 cm) Ht Readings from Last 3 Encounters:  01/29/16 5' 4.25" (1.632 m)  12/26/14 5' 4.25" (1.632 m)  12/27/13 5' 4.5" (1.638 m)    General appearance: alert, cooperative and appears stated age Head: Normocephalic, without obvious abnormality, atraumatic Neck: no adenopathy, supple, symmetrical, trachea midline and thyroid normal to inspection and palpation Lungs: clear to auscultation bilaterally Breasts: normal appearance, no masses or tenderness, No nipple retraction or dimpling, No nipple discharge or bleeding, No axillary or supraclavicular adenopathy Heart: regular rate and rhythm Abdomen: soft, non-tender; no masses,  no organomegaly Extremities: extremities normal, atraumatic, no cyanosis or edema Skin: Skin color, texture, turgor normal. No rashes or lesions Lymph nodes: Cervical,  supraclavicular, and axillary nodes normal. No abnormal inguinal nodes palpated Neurologic: Grossly normal   Pelvic: External genitalia:  no lesions              Urethra:  normal appearing urethra with no masses, tenderness or lesions              Bartholin's and Skene's: normal                 Vagina: normal appearing vagina with normal color and discharge, no lesions              Cervix: no cervical motion tenderness, no lesions and nulliparous appearance              Pap taken: No. Bimanual Exam:  Uterus:  normal size, contour, position, consistency, mobility, non-tender              Adnexa: normal adnexa and no mass, fullness, tenderness               Rectovaginal: Confirms               Anus:  normal sphincter tone, no lesions  Chaperone present: yes  A:  Well Woman with normal exam  Menopausal no HRT  RA with rheumatology and Dr. Deirdre Pippins management  Hypertension/cholesterol with Dr. Deirdre Pippins management  P:   Reviewed health and wellness pertinent to exam  Aware of need to evaluate if vaginal bleeding  Continue follow up with MD as indicated  Pap smear as above not taken   counseled on breast self exam, mammography screening, adequate intake of calcium and vitamin D, diet and exercise  return annually or prn  An After Visit Summary was printed and given to the patient.

## 2016-02-02 NOTE — Progress Notes (Signed)
Encounter reviewed Jill Jertson, MD   

## 2017-02-03 ENCOUNTER — Encounter: Payer: Self-pay | Admitting: Certified Nurse Midwife

## 2017-02-03 ENCOUNTER — Ambulatory Visit (INDEPENDENT_AMBULATORY_CARE_PROVIDER_SITE_OTHER): Payer: PRIVATE HEALTH INSURANCE | Admitting: Certified Nurse Midwife

## 2017-02-03 VITALS — BP 130/90 | HR 70 | Resp 16 | Ht 64.5 in | Wt 186.0 lb

## 2017-02-03 DIAGNOSIS — Z01419 Encounter for gynecological examination (general) (routine) without abnormal findings: Secondary | ICD-10-CM | POA: Diagnosis not present

## 2017-02-03 DIAGNOSIS — E785 Hyperlipidemia, unspecified: Secondary | ICD-10-CM | POA: Insufficient documentation

## 2017-02-03 DIAGNOSIS — N951 Menopausal and female climacteric states: Secondary | ICD-10-CM

## 2017-02-03 DIAGNOSIS — I10 Essential (primary) hypertension: Secondary | ICD-10-CM | POA: Insufficient documentation

## 2017-02-03 DIAGNOSIS — R7301 Impaired fasting glucose: Secondary | ICD-10-CM | POA: Insufficient documentation

## 2017-02-03 NOTE — Patient Instructions (Signed)

## 2017-02-03 NOTE — Progress Notes (Signed)
56 y.o. G0P0 Married  Caucasian Fe here for annual exam. Menopausal no HRT. Occasional hot flashes only. Denies vaginal bleeding, with occasional vaginal dryness. Has stopped seeing Dr Deirdre Pippins, sees [facility] practice, Raoul Pitch similar to Dr. Deirdre Pippins, with labs. Has appointment 12/18. Rheumatoid arthritis still stable. Does not see Rheumatology at this point. No other health issues today. Planning trip when ever her cats grow old!   Patient's last menstrual period was 12/02/2012 (approximate).          Sexually active: Yes.    The current method of family planning is post menopausal status.    Exercising: Yes.    yoga Smoker:  no  Health Maintenance: Pap:  12-26-14 neg HPV HR neg History of Abnormal Pap: yes MMG:  2017 normal per patient, to sign release Self Breast exams: no Colonoscopy:  2016 polyps f/u 34yr per pt BMD:   none TDaP:  2012 Shingles: no Pneumonia: no Hep C and HIV: not done Labs: no   reports that she has never smoked. She has never used smokeless tobacco. She reports that she does not drink alcohol or use drugs.  Past Medical History:  Diagnosis Date  . Foot pain, bilateral   . Hypercholesteremia   . Hypertension   . Rheumatoid arthritis ([facility]     Past Surgical History:  Procedure Laterality Date  . COLONOSCOPY  03/2012   (2) polyps    Current Outpatient Prescriptions  Medication Sig Dispense Refill  . Alpha-Lipoic Acid 600 MG CAPS Take 600 mg by mouth 2 (two) times daily.    . Ascorbic Acid (VITAMIN C PO) Take 2,000 mg by mouth 2 (two) times daily.    . Cholecalciferol (VITAMIN D PO) Take 10,000 Int'l Units by mouth daily.    . hydroxychloroquine (PLAQUENIL) 200 MG tablet Take by mouth 2 (two) times daily.    .Marland Kitchenlactobacillus acidophilus (BACID) TABS tablet Take 2 tablets by mouth daily.    .Marland Kitchenlevothyroxine (SYNTHROID, LEVOTHROID) 75 MCG tablet     . liothyronine (CYTOMEL) 25 MCG tablet     . MAGNESIUM PO Take 400 mg by mouth. 5 days per week    .  Methylcobalamin (METHYL B-12 PO) Take 5,000 mcg by mouth every other day.     .Marland KitchenMILK THISTLE PO Take 600 mg by mouth daily.    . minocycline (MINOCIN,DYNACIN) 50 MG capsule Take by mouth. Takes 10104mMon, Wed, Fri    . Omega-3 Fatty Acids (OMEGA-3 FISH OIL PO) Take by mouth.    . Marland KitchenNABLE TO FIND Curcumin 200042mwice daily    . UNABLE TO FIND 75 mg daily. pregnenolone     No current facility-administered medications for this visit.     Family History  Problem Relation Age of Onset  . Diabetes Mother   . Hypertension Mother   . Depression Mother   . Diabetes Father   . Multiple myeloma Father     ROS:  Pertinent items are noted in HPI.  Otherwise, a comprehensive ROS was negative.  Exam:   BP 130/90   Pulse 70   Resp 16   Ht 5' 4.5" (1.638 m)   Wt 186 lb (84.4 kg)   LMP 12/02/2012 (Approximate)   BMI 31.43 kg/m  Height: 5' 4.5" (163.8 cm) Ht Readings from Last 3 Encounters:  02/03/17 5' 4.5" (1.638 m)  01/29/16 5' 4.25" (1.632 m)  12/26/14 5' 4.25" (1.632 m)    General appearance: alert, cooperative and appears stated age Head: Normocephalic,  without obvious abnormality, atraumatic Neck: no adenopathy, supple, symmetrical, trachea midline and thyroid normal to inspection and palpation Lungs: clear to auscultation bilaterally Breasts: normal appearance, no masses or tenderness, No nipple retraction or dimpling, No nipple discharge or bleeding, No axillary or supraclavicular adenopathy Heart: regular rate and rhythm Abdomen: soft, non-tender; no masses,  no organomegaly Extremities: extremities normal, atraumatic, no cyanosis or edema Skin: Skin color, texture, turgor normal. No rashes or lesions Lymph nodes: Cervical, supraclavicular, and axillary nodes normal. No abnormal inguinal nodes palpated Neurologic: Grossly normal   Pelvic: External genitalia:  no lesions              Urethra:  normal appearing urethra with no masses, tenderness or lesions               Bartholin's and Skene's: normal                 Vagina: normal appearing vagina with normal color and discharge, no lesions              Cervix: no cervical motion tenderness, no lesions and normal appearance              Pap taken Bimanual Exam:  Uterus:  normal size, contour, position, consistency, mobility, non-tender and anteverted              Adnexa: normal adnexa and no mass, fullness, tenderness               Rectovaginal: Confirms               Anus:  normal sphincter tone, no lesions  Chaperone present: yes  A:  Well Woman with normal exam  Menopausal no HRT  Vaginal dryness  Rheumatoid arthritis stable, no MD follow up at present  P:   Reviewed health and wellness pertinent to exam  Aware of need to advise if vaginal bleeding  Discussed estrogen use and OTC product use, risks/benefits. Will try coconut oil first and advise. Instructions given.  Encouraged to follow up with PCP if not rheumatology  Pap smear: no  counseled on breast self exam, mammography screening, menopause, adequate intake of calcium and vitamin D, diet and exercise  return annually or prn  An After Visit Summary was printed and given to the patient.

## 2017-04-14 DIAGNOSIS — E039 Hypothyroidism, unspecified: Secondary | ICD-10-CM | POA: Insufficient documentation

## 2017-10-17 DIAGNOSIS — E02 Subclinical iodine-deficiency hypothyroidism: Secondary | ICD-10-CM | POA: Insufficient documentation

## 2017-10-17 DIAGNOSIS — Z780 Asymptomatic menopausal state: Secondary | ICD-10-CM | POA: Insufficient documentation

## 2018-02-16 ENCOUNTER — Other Ambulatory Visit (HOSPITAL_COMMUNITY)
Admission: RE | Admit: 2018-02-16 | Discharge: 2018-02-16 | Disposition: A | Discharge status: Home / Self Care | Payer: PRIVATE HEALTH INSURANCE | Source: Ambulatory Visit | Attending: Obstetrics & Gynecology | Admitting: Obstetrics & Gynecology

## 2018-02-16 ENCOUNTER — Ambulatory Visit (INDEPENDENT_AMBULATORY_CARE_PROVIDER_SITE_OTHER): Payer: PRIVATE HEALTH INSURANCE | Admitting: Certified Nurse Midwife

## 2018-02-16 ENCOUNTER — Encounter: Payer: Self-pay | Admitting: Certified Nurse Midwife

## 2018-02-16 ENCOUNTER — Other Ambulatory Visit: Payer: Self-pay

## 2018-02-16 VITALS — BP 130/78 | HR 72 | Resp 16 | Ht 64.25 in | Wt 187.0 lb

## 2018-02-16 DIAGNOSIS — Z124 Encounter for screening for malignant neoplasm of cervix: Secondary | ICD-10-CM | POA: Diagnosis not present

## 2018-02-16 DIAGNOSIS — N951 Menopausal and female climacteric states: Secondary | ICD-10-CM

## 2018-02-16 DIAGNOSIS — Z01419 Encounter for gynecological examination (general) (routine) without abnormal findings: Secondary | ICD-10-CM | POA: Diagnosis not present

## 2018-02-16 NOTE — Patient Instructions (Addendum)
EXERCISE AND DIET:  We recommended that you start or continue a regular exercise program for good health. Regular exercise means any activity that makes your heart beat faster and makes you sweat.  We recommend exercising at least 30 minutes per day at least 3 days a week, preferably 4 or 5.  We also recommend a diet low in fat and sugar.  Inactivity, poor dietary choices and obesity can cause diabetes, heart attack, stroke, and kidney damage, among others.    ALCOHOL AND SMOKING:  Women should limit their alcohol intake to no more than 7 drinks/beers/glasses of wine (combined, not each!) per week. Moderation of alcohol intake to this level decreases your risk of breast cancer and liver damage. And of course, no recreational drugs are part of a healthy lifestyle.  And absolutely no smoking or even second hand smoke. Most people know smoking can cause heart and lung diseases, but did you know it also contributes to weakening of your bones? Aging of your skin?  Yellowing of your teeth and nails?  CALCIUM AND VITAMIN D:  Adequate intake of calcium and Vitamin D are recommended.  The recommendations for exact amounts of these supplements seem to change often, but generally speaking 600 mg of calcium (either carbonate or citrate) and 800 units of Vitamin D per day seems prudent. Certain women may benefit from higher intake of Vitamin D.  If you are among these women, your doctor will have told you during your visit.    PAP SMEARS:  Pap smears, to check for cervical cancer or precancers,  have traditionally been done yearly, although recent scientific advances have shown that most women can have pap smears less often.  However, every woman still should have a physical exam from her gynecologist every year. It will include a breast check, inspection of the vulva and vagina to check for abnormal growths or skin changes, a visual exam of the cervix, and then an exam to evaluate the size and shape of the uterus and  ovaries.  And after 59 years of age, a rectal exam is indicated to check for rectal cancers. We will also provide age appropriate advice regarding health maintenance, like when you should have certain vaccines, screening for sexually transmitted diseases, bone density testing, colonoscopy, mammograms, etc.   MAMMOGRAMS:  All women over 40 years old should have a yearly mammogram. Many facilities now offer a "3D" mammogram, which may cost around $50 extra out of pocket. If possible,  we recommend you accept the option to have the 3D mammogram performed.  It both reduces the number of women who will be called back for extra views which then turn out to be normal, and it is better than the routine mammogram at detecting truly abnormal areas.    COLONOSCOPY:  Colonoscopy to screen for colon cancer is recommended for all women at age 50.  We know, you hate the idea of the prep.  We agree, BUT, having colon cancer and not knowing it is worse!!  Colon cancer so often starts as a polyp that can be seen and removed at colonscopy, which can quite literally save your life!  And if your first colonoscopy is normal and you have no family history of colon cancer, most women don't have to have it again for 10 years.  Once every ten years, you can do something that may end up saving your life, right?  We will be happy to help you get it scheduled when you are ready.    Be sure to check your insurance coverage so you understand how much it will cost.  It may be covered as a preventative service at no cost, but you should check your particular policy.      Atrophic Vaginitis Atrophic vaginitis is when the tissues that line the vagina become dry and thin. This is caused by a drop in estrogen. Estrogen helps:  To keep the vagina moist.  To make a clear fluid that helps: ? To lubricate the vagina for sex. ? To protect the vagina from infection.  If the lining of the vagina is dry and thin, it may:  Make sex painful. It  may also cause bleeding.  Cause a feeling of: ? Burning. ? Irritation. ? Itchiness.  Make an exam of your vagina painful. It may also cause bleeding.  Make you lose interest in sex.  Cause a burning feeling when you pee.  Make your vaginal fluid (discharge) brown or yellow.  For some women, there are no symptoms. This condition is most common in women who do not get their regular menstrual periods anymore (menopause). This often starts when a woman is 45-55 years old. Follow these instructions at home:  Take medicines only as told by your doctor. Do not use any herbal or alternative medicines unless your doctor says it is okay.  Use over-the-counter products for dryness only as told by your doctor. These include: ? Creams. ? Lubricants. ? Moisturizers.  Do not douche.  Do not use products that can make your vagina dry. These include: ? Scented feminine sprays. ? Scented tampons. ? Scented soaps.  If it hurts to have sex, tell your sexual partner. Contact a doctor if:  Your discharge looks different than normal.  Your vagina has an unusual smell.  You have new symptoms.  Your symptoms do not get better with treatment.  Your symptoms get worse. This information is not intended to replace advice given to you by your health care provider. Make sure you discuss any questions you have with your health care provider. Document Released: 10/14/2007 Document Revised: 10/03/2015 Document Reviewed: 04/18/2014 Elsevier Interactive Patient Education  2018 [employer]vier Inc[employer]  

## 2018-02-16 NOTE — Progress Notes (Signed)
57 y.o. G0P0 Married  Caucasian Fe here for annual exam.Menopausal no HRT. Denies vaginal bleeding or vaginal dryness. "Considering the age of 57" Sees Integr[facility] medicine for her RA management, labs, which is working for her.  Continues with Yoga for mobility which is working. Some hot flashes still this summer, but wondered if more RA issues and supplements. No other health issues today.  Patient's last menstrual period was 12/02/2012 (approximate).          Sexually active: Yes.    The current method of family planning is post menopausal status.    Exercising: Yes.    walking & yoga Smoker:  no  Review of Systems  Constitutional: Negative.   HENT: Negative.   Eyes: Negative.   Respiratory: Negative.   Cardiovascular: Negative.   Gastrointestinal: Negative.   Genitourinary: Negative.   Musculoskeletal:       Muscle or joint pain  Skin: Negative.   Endo/Heme/Allergies: Negative.   Psychiatric/Behavioral: Negative.     Health Maintenance: Pap:  12-14-13 neg HPV HR neg, 12-26-14 neg HPV HR neg History of Abnormal Pap: no MMG:  03-08-17 category b density birads 1:neg Self Breast exams: no Colonoscopy:  2016 polyps f/u 71yr BMD:   none TDaP:  2012 Shingles: not done Pneumonia: not done Hep C and HIV: not done Labs: if needed   reports that she has never smoked. She has never used smokeless tobacco. She reports that she does not drink alcohol or use drugs.  Past Medical History:  Diagnosis Date  . Foot pain, bilateral   . Hypercholesteremia   . Hypertension   . Rheumatoid arthritis ([facility]     Past Surgical History:  Procedure Laterality Date  . COLONOSCOPY  03/2012   (2) polyps    Current Outpatient Medications  Medication Sig Dispense Refill  . Alpha-Lipoic Acid 600 MG CAPS Take 600 mg by mouth 2 (two) times daily.    . Ascorbic Acid (VITAMIN C PO) Take 2,000 mg by mouth 2 (two) times daily.    . Cholecalciferol (VITAMIN D PO) Take 10,000 Int'l Units by mouth daily.     . hydroxychloroquine (PLAQUENIL) 200 MG tablet Take by mouth 2 (two) times daily.    .Marland Kitchenlactobacillus acidophilus (BACID) TABS tablet Take 2 tablets by mouth daily.    .Marland Kitchenlevothyroxine (SYNTHROID, LEVOTHROID) 75 MCG tablet     . liothyronine (CYTOMEL) 25 MCG tablet     . MAGNESIUM PO Take 400 mg by mouth. 5 days per week    . Methylcobalamin (METHYL B-12 PO) Take 5,000 mcg by mouth every other day.     .Marland KitchenMILK THISTLE PO Take 600 mg by mouth daily.    . minocycline (MINOCIN,DYNACIN) 50 MG capsule Take by mouth. Takes 1046mMon, Wed, Fri    . Omega-3 Fatty Acids (OMEGA-3 FISH OIL PO) Take by mouth.    . Marland KitchenNABLE TO FIND Curcumin 200045mwice daily    . UNABLE TO FIND 75 mg daily. pregnenolone     No current facility-administered medications for this visit.     Family History  Problem Relation Age of Onset  . Diabetes Mother   . Hypertension Mother   . Depression Mother   . Diabetes Father   . Multiple myeloma Father     ROS:  Pertinent items are noted in HPI.  Otherwise, a comprehensive ROS was negative.  Exam:   LMP 12/02/2012 (Approximate)    Ht Readings from Last 3 Encounters:  02/03/17 5' 4.5" (1.638  m)  01/29/16 5' 4.25" (1.632 m)  12/26/14 5' 4.25" (1.632 m)    General appearance: alert, cooperative and appears stated age Head: Normocephalic, without obvious abnormality, atraumatic Neck: no adenopathy, supple, symmetrical, trachea midline and thyroid normal to inspection and palpation Lungs: clear to auscultation bilaterally Breasts: normal appearance, no masses or tenderness, No nipple retraction or dimpling, No nipple discharge or bleeding, No axillary or supraclavicular adenopathy Heart: regular rate and rhythm Abdomen: soft, non-tender; no masses,  no organomegaly Extremities: extremities normal, atraumatic, no cyanosis or edema Skin: Skin color, texture, turgor normal. No rashes or lesions Lymph nodes: Cervical, supraclavicular, and axillary nodes normal. No  abnormal inguinal nodes palpated Neurologic: Grossly normal   Pelvic: External genitalia:  no lesions              Urethra:  normal appearing urethra with no masses, tenderness or lesions              Bartholin's and Skene's: normal                 Vagina: normal appearing vagina with normal color and scant moisture noted at introitus  Cervix; no cervical motion tenderness, no lesions and stenotic os with several attempts for endocervical cells for pap              Pap taken: Yes.   Bimanual Exam:  Uterus:  normal size, contour, position, consistency, mobility, non-tender and anteflexed              Adnexa: normal adnexa and no mass, fullness, tenderness               Rectovaginal: Confirms               Anus:  normal sphincter tone, no lesions  Chaperone present: yes  A:  Well Woman with normal exam  Menopausal no HRT  Vaginal dryness  RA under Integrative therapy management, stable per patient  Mammogram due this month  P:   Reviewed health and wellness pertinent to exam  Aware of need to advise if vaginal bleeding  Discussed vaginal dryness noted and discussed options for treatment with OTC Replens, coconut or Olive oil. Questions addressed.  Continue MD follow up with RA.  Patient to schedule mammogram  Pap smear: yes   counseled on breast self exam, mammography screening, feminine hygiene, adequate intake of calcium and vitamin D, diet and exercise  return annually or prn  An After Visit Summary was printed and given to the patient.

## 2018-02-18 LAB — CYTOLOGY - PAP
DIAGNOSIS: NEGATIVE
HPV: NOT DETECTED

## 2018-04-26 ENCOUNTER — Encounter: Payer: Self-pay | Admitting: Certified Nurse Midwife

## 2019-02-21 ENCOUNTER — Other Ambulatory Visit: Payer: Self-pay

## 2019-02-21 NOTE — Progress Notes (Signed)
58 y.o. G0P0 Married  Caucasian Fe here for annual exam. Menopausal no HRT. Denies vaginal bleeding or vaginal dryness. Sees urgent care if needed. Sees Dr. Owens Shark at [facility], labs and [facility] management. Staying active and staying in. No health issues today.   Patient's last menstrual period was 12/02/2012 (approximate).          Sexually active: Yes.    The current method of family planning is post menopausal status.    Exercising: Yes.    walking Smoker:  no  Review of Systems  Constitutional: Negative.   HENT: Negative.   Eyes: Negative.   Respiratory: Negative.   Cardiovascular: Negative.   Gastrointestinal: Negative.   Genitourinary: Negative.   Musculoskeletal: Negative.   Skin: Negative.   Neurological: Negative.   Endo/Heme/Allergies: Negative.   Psychiatric/Behavioral: Negative.     Health Maintenance: Pap:  12-26-14 neg HPV HR neg, 02-16-18 neg HPV HR neg History of Abnormal Pap: no MMG:  03-10-18 category a density birads 1:neg Self Breast exams: no Colonoscopy: 2013 polyps, 2016 polyps f/u 82yr BMD:   none TDaP:  2012 Shingles: not done Pneumonia: not done Hep C and HIV: not done Labs: with Integrative medication   reports that she has never smoked. She has never used smokeless tobacco. She reports that she does not drink alcohol or use drugs.  Past Medical History:  Diagnosis Date  . Foot pain, bilateral   . Hypercholesteremia   . Hypertension   . Rheumatoid arthritis ([facility]     Past Surgical History:  Procedure Laterality Date  . COLONOSCOPY  03/2012   (2) polyps    Current Outpatient Medications  Medication Sig Dispense Refill  . Alpha-Lipoic Acid 600 MG CAPS Take 600 mg by mouth 2 (two) times daily.    . Ascorbic Acid (VITAMIN C PO) Take 2,000 mg by mouth 2 (two) times daily.    . Cholecalciferol (VITAMIN D PO) Take 10,000 Int'l Units by mouth daily.    . diclofenac (VOLTAREN) 75 MG EC tablet Take 75 mg by mouth as needed.    .  lactobacillus acidophilus (BACID) TABS tablet Take 2 tablets by mouth daily.    .Marland KitchenMAGNESIUM PO Take 400 mg by mouth. 5 days per week    . Methylcobalamin (METHYL B-12 PO) Take 5,000 mcg by mouth every other day.     .Marland KitchenMILK THISTLE PO Take 600 mg by mouth daily.    . minocycline (MINOCIN,DYNACIN) 50 MG capsule Take by mouth. Takes 1063mMon, Wed, Fri    . Multiple Vitamins-Minerals (ZINC PO) Take by mouth.    . Omega-3 Fatty Acids (OMEGA-3 FISH OIL PO) Take by mouth.     No current facility-administered medications for this visit.     Family History  Problem Relation Age of Onset  . Diabetes Mother   . Hypertension Mother   . Depression Mother   . Diabetes Father   . Multiple myeloma Father     ROS:  Pertinent items are noted in HPI.  Otherwise, a comprehensive ROS was negative.  Exam:   BP (!) 150/84   Pulse 70   Temp (!) 97.2 F (36.2 C) (Skin)   Resp 16   Ht 5' 4.25" (1.632 m)   Wt 186 lb (84.4 kg)   LMP 12/02/2012 (Approximate)   BMI 31.68 kg/m  Height: 5' 4.25" (163.2 cm) Ht Readings from Last 3 Encounters:  02/22/19 5' 4.25" (1.632 m)  02/16/18 5' 4.25" (1.632 m)  02/03/17 5' 4.5" (  1.638 m)    General appearance: alert, cooperative and appears stated age Head: Normocephalic, without obvious abnormality, atraumatic Neck: no adenopathy, supple, symmetrical, trachea midline and thyroid normal to inspection and palpation Lungs: clear to auscultation bilaterally Breasts: normal appearance, no masses or tenderness, No nipple retraction or dimpling, No nipple discharge or bleeding, No axillary or supraclavicular adenopathy Heart: regular rate and rhythm Abdomen: soft, non-tender; no masses,  no organomegaly Extremities: extremities normal, atraumatic, no cyanosis or edema Skin: Skin color, texture, turgor normal. No rashes or lesions Lymph nodes: Cervical, supraclavicular, and axillary nodes normal. No abnormal inguinal nodes palpated Neurologic: Grossly  normal   Pelvic: External genitalia:  no lesions              Urethra:  normal appearing urethra with no masses, tenderness or lesions              Bartholin's and Skene's: normal                 Vagina: normal appearing vagina with normal color and discharge, no lesions              Cervix: no cervical motion tenderness, no lesions and normal appearance              Pap taken: No. Bimanual Exam:  Uterus:  normal size, contour, position, consistency, mobility, non-tender and anteverted              Adnexa: normal adnexa and no mass, fullness, tenderness               Rectovaginal: Confirms               Anus:  normal sphincter tone, no lesions  Chaperone present: yes  A:  Well Woman with normal exam  Post menopausal no HRT  Integrative medicine management of labs and supplements  BMD due    P:   Reviewed health and wellness pertinent to exam  Aware of need to advise if vaginal bleeding.  Continue follow up with Integrative medicine as indicated.  Discussed BMD recommended at 58 for baseline and bone health once in menopause. Declines scheduling at this time. Will advise.  Pap smear: no   counseled on breast self exam, mammography screening, feminine hygiene, menopause, adequate intake of calcium and vitamin D  return annually or prn  An After Visit Summary was printed and given to the patient.

## 2019-02-22 ENCOUNTER — Other Ambulatory Visit: Payer: Self-pay

## 2019-02-22 ENCOUNTER — Ambulatory Visit (INDEPENDENT_AMBULATORY_CARE_PROVIDER_SITE_OTHER): Payer: PRIVATE HEALTH INSURANCE | Admitting: Certified Nurse Midwife

## 2019-02-22 ENCOUNTER — Encounter: Payer: Self-pay | Admitting: Certified Nurse Midwife

## 2019-02-22 VITALS — BP 150/84 | HR 70 | Temp 97.2°F | Resp 16 | Ht 64.25 in | Wt 186.0 lb

## 2019-02-22 DIAGNOSIS — Z01419 Encounter for gynecological examination (general) (routine) without abnormal findings: Secondary | ICD-10-CM

## 2019-02-22 NOTE — Patient Instructions (Signed)
EXERCISE AND DIET:  We recommended that you start or continue a regular exercise program for good health. Regular exercise means any activity that makes your heart beat faster and makes you sweat.  We recommend exercising at least 30 minutes per day at least 3 days a week, preferably 4 or 5.  We also recommend a diet low in fat and sugar.  Inactivity, poor dietary choices and obesity can cause diabetes, heart attack, stroke, and kidney damage, among others.    ALCOHOL AND SMOKING:  Women should limit their alcohol intake to no more than 7 drinks/beers/glasses of wine (combined, not each!) per week. Moderation of alcohol intake to this level decreases your risk of breast cancer and liver damage. And of course, no recreational drugs are part of a healthy lifestyle.  And absolutely no smoking or even second hand smoke. Most people know smoking can cause heart and lung diseases, but did you know it also contributes to weakening of your bones? Aging of your skin?  Yellowing of your teeth and nails?  CALCIUM AND VITAMIN D:  Adequate intake of calcium and Vitamin D are recommended.  The recommendations for exact amounts of these supplements seem to change often, but generally speaking 600 mg of calcium (either carbonate or citrate) and 800 units of Vitamin D per day seems prudent. Certain women may benefit from higher intake of Vitamin D.  If you are among these women, your doctor will have told you during your visit.    PAP SMEARS:  Pap smears, to check for cervical cancer or precancers,  have traditionally been done yearly, although recent scientific advances have shown that most women can have pap smears less often.  However, every woman still should have a physical exam from her gynecologist every year. It will include a breast check, inspection of the vulva and vagina to check for abnormal growths or skin changes, a visual exam of the cervix, and then an exam to evaluate the size and shape of the uterus and  ovaries.  And after 60 years of age, a rectal exam is indicated to check for rectal cancers. We will also provide age appropriate advice regarding health maintenance, like when you should have certain vaccines, screening for sexually transmitted diseases, bone density testing, colonoscopy, mammograms, etc.   MAMMOGRAMS:  All women over 40 years old should have a yearly mammogram. Many facilities now offer a "3D" mammogram, which may cost around $50 extra out of pocket. If possible,  we recommend you accept the option to have the 3D mammogram performed.  It both reduces the number of women who will be called back for extra views which then turn out to be normal, and it is better than the routine mammogram at detecting truly abnormal areas.    COLONOSCOPY:  Colonoscopy to screen for colon cancer is recommended for all women at age 50.  We know, you hate the idea of the prep.  We agree, BUT, having colon cancer and not knowing it is worse!!  Colon cancer so often starts as a polyp that can be seen and removed at colonscopy, which can quite literally save your life!  And if your first colonoscopy is normal and you have no family history of colon cancer, most women don't have to have it again for 10 years.  Once every ten years, you can do something that may end up saving your life, right?  We will be happy to help you get it scheduled when you are ready.    Be sure to check your insurance coverage so you understand how much it will cost.  It may be covered as a preventative service at no cost, but you should check your particular policy.      Bone Density Test The bone density test uses a special type of X-ray to measure the amount of calcium and other minerals in your bones. It can measure bone density in the hip and the spine. The test procedure is similar to having a regular X-ray. This test may also be called:  Bone densitometry.  Bone mineral density test.  Dual-energy X-ray absorptiometry (DEXA). You  may have this test to:  Diagnose a condition that causes weak or thin bones (osteoporosis).  Screen you for osteoporosis.  Predict your risk for a broken bone (fracture).  Determine how well your osteoporosis treatment is working. Tell a health care provider about:  Any allergies you have.  All medicines you are taking, including vitamins, herbs, eye drops, creams, and over-the-counter medicines.  Any problems you or family members have had with anesthetic medicines.  Any blood disorders you have.  Any surgeries you have had.  Any medical conditions you have.  Whether you are pregnant or may be pregnant.  Any medical tests you have had within the past 14 days that used contrast material. What are the risks? Generally, this is a safe procedure. However, it does expose you to a small amount of radiation, which can slightly increase your cancer risk. What happens before the procedure?  Do not take any calcium supplements starting 24 hours before your test.  Remove all metal jewelry, eyeglasses, dental appliances, and any other metal objects. What happens during the procedure?   You will lie down on an exam table. There will be an X-ray generator below you and an imaging device above you.  Other devices, such as boxes or braces, may be used to position your body properly for the scan.  The machine will slowly scan your body. You will need to keep still.  The images will show up on a screen in the room. Images will be examined by a specialist after your test is done. The procedure may vary among health care providers and hospitals. What happens after the procedure?  It is up to you to get your test results. Ask your health care provider, or the department that is doing the test, when your results will be ready. Summary  A bone density test is an imaging test that uses a type of X-ray to measure the amount of calcium and other minerals in your bones.  The test may be used  to diagnose or screen you for a condition that causes weak or thin bones (osteoporosis), predict your risk for a broken bone (fracture), or determine how well your osteoporosis treatment is working.  Do not take any calcium supplements starting 24 hours before your test.  Ask your health care provider, or the department that is doing the test, when your results will be ready. This information is not intended to replace advice given to you by your health care provider. Make sure you discuss any questions you have with your health care provider. Document Released: 05/19/2004 Document Revised: 05/13/2017 Document Reviewed: 03/01/2017 Elsevier Patient Education  2020 [employer].

## 2019-08-02 ENCOUNTER — Encounter: Payer: Self-pay | Admitting: Certified Nurse Midwife

## 2020-02-28 ENCOUNTER — Ambulatory Visit: Payer: PRIVATE HEALTH INSURANCE | Admitting: Certified Nurse Midwife

## 2020-02-29 NOTE — Progress Notes (Signed)
59 y.o. G0P0 Married White or Caucasian Not Hispanic or Latino female here for annual exam.  No vaginal bleeding. Some dyspareunia, even with lubrication.   She sees an integrative medicine specialist. Not on traditional medication for her RA, on minocycline. Working well. Mild symptoms. Still plays the piano and crocheting. Does yoga.     She was diagnosed with prediabetes earlier this year. Has adjusted her diet and has lost 16 lbs   Patient's last menstrual period was 12/02/2012 (approximate).          Sexually active: Yes.    The current method of family planning is post menopausal status.    Exercising: Yes.    yoga and walking Smoker:  no  Health Maintenance: Pap:  02-16-18 neg HPV HR neg  12/26/14 Neg:Neg HR HPV History of abnormal Pap:  no MMG:  04/10/19 BIRADS 1 negative BMD:   none Colonoscopy: 2016 f/u 5 years, she has called will schedule.  TDaP:  2012 Gardasil: n/a   reports that she has never smoked. She has never used smokeless tobacco. She reports that she does not drink alcohol and does not use drugs. Homemaker.  Past Medical History:  Diagnosis Date   Foot pain, bilateral    Hypercholesteremia    Hypertension    Rheumatoid arthritis ([place])     Past Surgical History:  Procedure Laterality Date   COLONOSCOPY  03/2012   (2) polyps    Current Outpatient Medications  Medication Sig Dispense Refill   Alpha-Lipoic Acid 600 MG CAPS Take 600 mg by mouth 2 (two) times daily.     Ascorbic Acid (VITAMIN C PO) Take 2,000 mg by mouth 2 (two) times daily.     Barberry-Oreg Grape-Goldenseal (BERBERINE COMPLEX PO) Take by mouth.     Cholecalciferol (VITAMIN D PO) Take 10,000 Int'l Units by mouth daily.     diclofenac (VOLTAREN) 75 MG EC tablet Take 75 mg by mouth as needed.     lactobacillus acidophilus (BACID) TABS tablet Take 2 tablets by mouth daily.     MAGNESIUM PO Take 400 mg by mouth. 5 days per week     Methylcobalamin (METHYL B-12 PO) Take 5,000 mcg  by mouth every other day.      MILK THISTLE PO Take 600 mg by mouth daily.     minocycline (MINOCIN,DYNACIN) 50 MG capsule Take by mouth. Takes 171m Mon, Wed, Fri     Omega-3 Fatty Acids (OMEGA-3 FISH OIL PO) Take by mouth.     No current facility-administered medications for this visit.    Family History  Problem Relation Age of Onset   Diabetes Mother    Hypertension Mother    Depression Mother    Diabetes Father    Multiple myeloma Father     Review of Systems  Constitutional: Negative.   HENT: Negative.   Eyes: Negative.   Respiratory: Negative.   Cardiovascular: Negative.   Gastrointestinal: Negative.   Endocrine: Negative.   Genitourinary: Negative.   Musculoskeletal: Negative.   Skin: Negative.   Allergic/Immunologic: Negative.   Neurological: Negative.   Hematological: Negative.   Psychiatric/Behavioral: Negative.     Exam:   BP 118/76 (BP Location: Left Arm, Patient Position: Sitting, Cuff Size: Normal)    Pulse 85    Ht 5' 4.25" (1.632 m)    Wt 170 lb (77.1 kg)    LMP 12/02/2012 (Approximate)    SpO2 98%    BMI 28.95 kg/m   Weight change: @WEIGHTCHANGE @ Height:   Height:  5' 4.25" (163.2 cm)  Ht Readings from Last 3 Encounters:  03/01/20 5' 4.25" (1.632 m)  02/22/19 5' 4.25" (1.632 m)  02/16/18 5' 4.25" (1.632 m)    General appearance: alert, cooperative and appears stated age Head: Normocephalic, without obvious abnormality, atraumatic Neck: no adenopathy, supple, symmetrical, trachea midline and thyroid normal to inspection and palpation Lungs: clear to auscultation bilaterally Cardiovascular: regular rate and rhythm Breasts: normal appearance, no masses or tenderness Abdomen: soft, non-tender; non distended,  no masses,  no organomegaly Extremities: extremities normal, atraumatic, no cyanosis or edema Skin: Skin color, texture, turgor normal. No rashes or lesions Lymph nodes: Cervical, supraclavicular, and axillary nodes normal. No abnormal  inguinal nodes palpated Neurologic: Grossly normal   Pelvic: External genitalia:  no lesions              Urethra:  normal appearing urethra with no masses, tenderness or lesions              Bartholins and Skenes: normal                 Vagina: mildly atrophic appearing vagina with normal color and discharge, no lesions. Able to insert 2 fingers vaginally.               Cervix: no lesions and enlongated               Bimanual Exam:  Uterus:  normal size, contour, position, consistency, mobility, non-tender              Adnexa: no mass, fullness, tenderness               Rectovaginal: Confirms               Anus:  normal sphincter tone, no lesions  York Cerise chaperoned for the exam.  A:  Well Woman with normal exam  Vaginal atrophy, dyspareunia. Using lubrication.  P:   No pap this year  Mammogram due next month  Colonoscopy due, she will schedule  Labs with other MD  Discussed breast self exam  Discussed calcium and vit D intake  Discussed the option of vaginal estrogen, she will call if she wants to try it.

## 2020-03-01 ENCOUNTER — Encounter: Payer: Self-pay | Admitting: Obstetrics and Gynecology

## 2020-03-01 ENCOUNTER — Ambulatory Visit (INDEPENDENT_AMBULATORY_CARE_PROVIDER_SITE_OTHER): Payer: Commercial Managed Care - PPO | Admitting: Obstetrics and Gynecology

## 2020-03-01 ENCOUNTER — Other Ambulatory Visit: Payer: Self-pay

## 2020-03-01 VITALS — BP 118/76 | HR 85 | Ht 64.25 in | Wt 170.0 lb

## 2020-03-01 DIAGNOSIS — N941 Unspecified dyspareunia: Secondary | ICD-10-CM

## 2020-03-01 DIAGNOSIS — N952 Postmenopausal atrophic vaginitis: Secondary | ICD-10-CM | POA: Diagnosis not present

## 2020-03-01 DIAGNOSIS — Z01419 Encounter for gynecological examination (general) (routine) without abnormal findings: Secondary | ICD-10-CM | POA: Diagnosis not present

## 2020-03-01 DIAGNOSIS — R7303 Prediabetes: Secondary | ICD-10-CM | POA: Insufficient documentation

## 2020-03-01 NOTE — Patient Instructions (Signed)
EXERCISE AND DIET:  We recommended that you start or continue a regular exercise program for good health. Regular exercise means any activity that makes your heart beat faster and makes you sweat.  We recommend exercising at least 30 minutes per day at least 3 days a week, preferably 4 or 5.  We also recommend a diet low in fat and sugar.  Inactivity, poor dietary choices and obesity can cause diabetes, heart attack, stroke, and kidney damage, among others.    ALCOHOL AND SMOKING:  Women should limit their alcohol intake to no more than 7 drinks/beers/glasses of wine (combined, not each!) per week. Moderation of alcohol intake to this level decreases your risk of breast cancer and liver damage. And of course, no recreational drugs are part of a healthy lifestyle.  And absolutely no smoking or even second hand smoke. Most people know smoking can cause heart and lung diseases, but did you know it also contributes to weakening of your bones? Aging of your skin?  Yellowing of your teeth and nails?  CALCIUM AND VITAMIN D:  Adequate intake of calcium and Vitamin D are recommended.  The recommendations for exact amounts of these supplements seem to change often, but generally speaking 1,200 mg of calcium (between diet and supplement) and 800 units of Vitamin D per day seems prudent. Certain women may benefit from higher intake of Vitamin D.  If you are among these women, your doctor will have told you during your visit.    PAP SMEARS:  Pap smears, to check for cervical cancer or precancers,  have traditionally been done yearly, although recent scientific advances have shown that most women can have pap smears less often.  However, every woman still should have a physical exam from her gynecologist every year. It will include a breast check, inspection of the vulva and vagina to check for abnormal growths or skin changes, a visual exam of the cervix, and then an exam to evaluate the size and shape of the uterus and  ovaries.  And after 61 years of age, a rectal exam is indicated to check for rectal cancers. We will also provide age appropriate advice regarding health maintenance, like when you should have certain vaccines, screening for sexually transmitted diseases, bone density testing, colonoscopy, mammograms, etc.   MAMMOGRAMS:  All women over 40 years old should have a yearly mammogram. Many facilities now offer a "3D" mammogram, which may cost around $50 extra out of pocket. If possible,  we recommend you accept the option to have the 3D mammogram performed.  It both reduces the number of women who will be called back for extra views which then turn out to be normal, and it is better than the routine mammogram at detecting truly abnormal areas.    COLON CANCER SCREENING: Now recommend starting at age 45. At this time colonoscopy is not covered for routine screening until 50. There are take home tests that can be done between 45-49.   COLONOSCOPY:  Colonoscopy to screen for colon cancer is recommended for all women at age 50.  We know, you hate the idea of the prep.  We agree, BUT, having colon cancer and not knowing it is worse!!  Colon cancer so often starts as a polyp that can be seen and removed at colonscopy, which can quite literally save your life!  And if your first colonoscopy is normal and you have no family history of colon cancer, most women don't have to have it again for   10 years.  Once every ten years, you can do something that may end up saving your life, right?  We will be happy to help you get it scheduled when you are ready.  Be sure to check your insurance coverage so you understand how much it will cost.  It may be covered as a preventative service at no cost, but you should check your particular policy.      Breast Self-Awareness Breast self-awareness means being familiar with how your breasts look and feel. It involves checking your breasts regularly and reporting any changes to your  health care provider. Practicing breast self-awareness is important. A change in your breasts can be a sign of a serious medical problem. Being familiar with how your breasts look and feel allows you to find any problems early, when treatment is more likely to be successful. All women should practice breast self-awareness, including women who have had breast implants. How to do a breast self-exam One way to learn what is normal for your breasts and whether your breasts are changing is to do a breast self-exam. To do a breast self-exam: Look for Changes  1. Remove all the clothing above your waist. 2. Stand in front of a mirror in a room with good lighting. 3. Put your hands on your hips. 4. Push your hands firmly downward. 5. Compare your breasts in the mirror. Look for differences between them (asymmetry), such as: ? Differences in shape. ? Differences in size. ? Puckers, dips, and bumps in one breast and not the other. 6. Look at each breast for changes in your skin, such as: ? Redness. ? Scaly areas. 7. Look for changes in your nipples, such as: ? Discharge. ? Bleeding. ? Dimpling. ? Redness. ? A change in position. Feel for Changes Carefully feel your breasts for lumps and changes. It is best to do this while lying on your back on the floor and again while sitting or standing in the shower or tub with soapy water on your skin. Feel each breast in the following way:  Place the arm on the side of the breast you are examining above your head.  Feel your breast with the other hand.  Start in the nipple area and make  inch (2 cm) overlapping circles to feel your breast. Use the pads of your three middle fingers to do this. Apply light pressure, then medium pressure, then firm pressure. The light pressure will allow you to feel the tissue closest to the skin. The medium pressure will allow you to feel the tissue that is a little deeper. The firm pressure will allow you to feel the tissue  close to the ribs.  Continue the overlapping circles, moving downward over the breast until you feel your ribs below your breast.  Move one finger-width toward the center of the body. Continue to use the  inch (2 cm) overlapping circles to feel your breast as you move slowly up toward your collarbone.  Continue the up and down exam using all three pressures until you reach your armpit.  Write Down What You Find  Write down what is normal for each breast and any changes that you find. Keep a written record with breast changes or normal findings for each breast. By writing this information down, you do not need to depend only on memory for size, tenderness, or location. Write down where you are in your menstrual cycle, if you are still menstruating. If you are having trouble noticing differences   in your breasts, do not get discouraged. With time you will become more familiar with the variations in your breasts and more comfortable with the exam. How often should I examine my breasts? Examine your breasts every month. If you are breastfeeding, the best time to examine your breasts is after a feeding or after using a breast pump. If you menstruate, the best time to examine your breasts is 5-7 days after your period is over. During your period, your breasts are lumpier, and it may be more difficult to notice changes. When should I see my health care provider? See your health care provider if you notice:  A change in shape or size of your breasts or nipples.  A change in the skin of your breast or nipples, such as a reddened or scaly area.  Unusual discharge from your nipples.  A lump or thick area that was not there before.  Pain in your breasts.  Anything that concerns you.  Atrophic Vaginitis Atrophic vaginitis is a condition in which the tissues that line the vagina become dry and thin. This condition occurs in women who have stopped having their period. It is caused by a drop in a  female hormone (estrogen). This hormone helps:  To keep the vagina moist.  To make a clear fluid. This clear fluid helps: ? To make the vagina ready for sex. ? To protect the vagina from infection. If the lining of the vagina is dry and thin, it may cause irritation, burning, or itchiness. It may also:  Make sex painful.  Make an exam of your vagina painful.  Cause bleeding.  Make you lose interest in sex.  Cause a burning feeling when you pee (urinate).  Cause a brown or yellow fluid to come from your vagina. Some women do not have symptoms. Follow these instructions at home: Medicines  Take over-the-counter and prescription medicines only as told by your doctor.  Do not use herbs or other medicines unless your doctor says it is okay.  Use medicines for for dryness. These include: ? Oils to make the vagina soft. ? Creams. ? Moisturizers. General instructions  Do not douche.  Do not use products that can make your vagina dry. These include: ? Scented sprays. ? Scented tampons. ? Scented soaps.  Sex can help increase blood flow and soften the tissue in the vagina. If it hurts to have sex: ? Tell your partner. ? Use products to make sex more comfortable. Use these only as told by your doctor. Contact a doctor if you:  Have discharge from the vagina that is different than usual.  Have a bad smell coming from your vagina.  Have new symptoms.  Do not get better.  Get worse. Summary  Atrophic vaginitis is a condition in which the lining of the vagina becomes dry and thin.  This condition affects women who have stopped having their periods.  Treatment may include using products that help make the vagina soft.  Call a doctor if do not get better with treatment. This information is not intended to replace advice given to you by your health care provider. Make sure you discuss any questions you have with your health care provider. Document Revised: 05/10/2017  Document Reviewed: 05/10/2017 Elsevier Patient Education  2020 [employer].

## 2020-03-08 ENCOUNTER — Ambulatory Visit: Payer: PRIVATE HEALTH INSURANCE | Admitting: Obstetrics and Gynecology

## 2020-05-14 LAB — HM COLONOSCOPY

## 2020-09-27 NOTE — Progress Notes (Signed)
[place] Phone: [phone] Subjective:   [name], am serving as a scribe for Dr. Hulan Saas. This visit occurred during the SARS-CoV-2 public health emergency.  Safety protocols were in place, including screening questions prior to the visit, additional usage of staff PPE, and extensive cleaning of exam room while observing appropriate contact time as indicated for disinfecting solutions.   I'm seeing this patient by the request  of:  Patient, No Pcp Per (Inactive)  CC: Left knee pain and right leg pain  [id number]  Julie [name] is a 60 y.o. female coming in with complaint of L knee pain. Patient states her pain is over medial aspect of knee for one month since an RA flare. Was having difficulty walking up stairs. Has not been able to walk for exercise but continues to do yoga. Pain has improved. History of Baker's cyst 2015.   R wrist pain for one month as well. Notes swelling in her wrist and it feels "hard" over palmer aspect.  Patient would like to know what it is.  Seems to come and go.  Seems to have more tightness noted.       Past Medical History:  Diagnosis Date  . Foot pain, bilateral   . Hypercholesteremia   . Hypertension   . Prediabetes   . Rheumatoid arthritis [facility])    Past Surgical History:  Procedure Laterality Date  . COLONOSCOPY  03/2012   (2) polyps   Social History   Socioeconomic History  . Marital status: Married    Spouse name: Not on file  . Number of children: Not on file  . Years of education: Not on file  . Highest education level: Not on file  Occupational History  . Not on file  Tobacco Use  . Smoking status: Never Smoker  . Smokeless tobacco: Never Used  Vaping Use  . Vaping Use: Never used  Substance and Sexual Activity  . Alcohol use: No    Alcohol/week: 0.0 standard drinks  . Drug use: No  . Sexual activity: Yes    Partners: Male    Birth  control/protection: Post-menopausal  Other Topics Concern  . Not on file  Social History Narrative  . Not on file   Social Determinants of Health   Financial Resource Strain: Not on file  Food Insecurity: Not on file  Transportation Needs: Not on file  Physical Activity: Not on file  Stress: Not on file  Social Connections: Not on file   Allergies  Allergen Reactions  . Zithromax [Azithromycin] Hives   Family History  Problem Relation Age of Onset  . Diabetes Mother   . Hypertension Mother   . Depression Mother   . Diabetes Father   . Multiple myeloma Father        Current Outpatient Medications (Analgesics):  .  diclofenac (VOLTAREN) 75 MG EC tablet, Take 75 mg by mouth as needed.  Current Outpatient Medications (Hematological):  Marland Kitchen  Methylcobalamin (METHYL B-12 PO), Take 5,000 mcg by mouth every other day.   Current Outpatient Medications (Other):  Marland Kitchen  Alpha-Lipoic Acid 600 MG CAPS, Take 600 mg by mouth 2 (two) times daily. .  Ascorbic Acid (VITAMIN C PO), Take 2,000 mg by mouth 2 (two) times daily. Jolyne Loa Grape-Goldenseal (BERBERINE COMPLEX PO), Take by mouth. .  Cholecalciferol (VITAMIN D PO), Take 10,000 Int'l Units by mouth daily. Marland Kitchen  lactobacillus acidophilus (BACID) TABS tablet, Take 2  tablets by mouth daily. Marland Kitchen  MAGNESIUM PO, Take 400 mg by mouth. 5 days per week .  MILK THISTLE PO, Take 600 mg by mouth daily. .  minocycline (MINOCIN,DYNACIN) 50 MG capsule, Take by mouth. Takes 112m Mon, Wed, Fri .  Omega-3 Fatty Acids (OMEGA-3 FISH OIL PO), Take by mouth.   Reviewed prior external information including notes and imaging from  primary care provider As well as notes that were available from care everywhere and other healthcare systems.  Past medical history, social, surgical and family history all reviewed in electronic medical record.  No pertanent information unless stated regarding to the chief complaint.   Review of Systems:  No headache,  visual changes, nausea, vomiting, diarrhea, constipation, dizziness, abdominal pain, skin rash, fevers, chills, night sweats, weight loss, swollen lymph nodes, body aches, joint swelling, chest pain, shortness of breath, mood changes. POSITIVE muscle aches  Objective  Blood pressure (!) 140/96, pulse 86, height _0  (1.626 m), weight 171 lb (77.6 kg), last menstrual period 12/02/2012, SpO2 99 %.   General: No apparent distress alert and oriented x3 mood and affect normal, dressed appropriately.  HEENT: Pupils equal, extraocular movements intact  Respiratory: Patient's speak in full sentences and does not appear short of breath  Cardiovascular: No lower extremity edema, non tender, no erythema  Gait normal with good balance and coordination.  MSK: Left knee exam has full range of motion noted.  Patient does have lacking the last 5 degrees of extension.  Patient has no significant fullness noted in the posterior aspect of the knee.  No significant instability noted.  Right wrist exam shows a fullness noted on the palmar aspect of the wrist noted.  Seems to be more ulnar than radial side.  Difficult to assess if there is any type of mass versus inflammation.  Patient does have good range of motion noted.  Good grip strength.  Neurovascularly intact.  Limited musculoskeletal ultrasound was performed and interpreted by ZLyndal Pulley Limited ultrasound of patient's left knee shows the patient did have a very trace patellofemoral arthritis and mild medial compartment arthritis.  Patient has a trace effusion of the patellofemoral joint still noted.  Mild synovitis potentially noted.  Meniscus appear to be unremarkable.  Very small Baker cyst noted.  Regarding left wrist patient does have hypoechoic changes in multiple different flexor tendon sheaths noted.  Significant increase in Doppler flow surrounding the musculature.  On longitudinal view of the tendon questionable cyst formation noted but very  difficult to tell.  Impression: Regarding the knee mild arthritic changes with resolving synovitis Regarding the wrist questionable cyst formation versus severe tenosynovitis of the flexor compartments of the wrist    Impression and Recommendations:     The above documentation has been reviewed and is accurate and complete ZLyndal Pulley DO

## 2020-10-01 ENCOUNTER — Encounter: Payer: Self-pay | Admitting: Family Medicine

## 2020-10-01 ENCOUNTER — Other Ambulatory Visit: Payer: Self-pay

## 2020-10-01 ENCOUNTER — Ambulatory Visit: Payer: Self-pay

## 2020-10-01 ENCOUNTER — Ambulatory Visit (INDEPENDENT_AMBULATORY_CARE_PROVIDER_SITE_OTHER): Payer: Commercial Managed Care - PPO

## 2020-10-01 ENCOUNTER — Ambulatory Visit (INDEPENDENT_AMBULATORY_CARE_PROVIDER_SITE_OTHER): Payer: Commercial Managed Care - PPO | Admitting: Family Medicine

## 2020-10-01 VITALS — BP 140/96 | HR 86 | Ht 64.0 in | Wt 171.0 lb

## 2020-10-01 DIAGNOSIS — M25531 Pain in right wrist: Secondary | ICD-10-CM

## 2020-10-01 DIAGNOSIS — M25562 Pain in left knee: Secondary | ICD-10-CM | POA: Insufficient documentation

## 2020-10-01 NOTE — Patient Instructions (Addendum)
Xray today Wrist might be a cyst vs flare of RA Continue glove at night Try Pennsaid 2x a day Worsening pain consider MRI Exercises for knee Ice after activity See me again in 4-6 weeks

## 2020-10-01 NOTE — Assessment & Plan Note (Signed)
Patient has what appears to be somewhat of a tendinitis noted mostly mostly involving the flexor compartments noted.  In addition to this though there is a questionable cyst formation noted.  Increasing in Doppler flow surrounding all the tendons.  Discussed that this could be secondary to the rheumatoid arthritis flare.  Discussed the potential for the cyst.  X-rays are pending.  Discussed with patient about range of motion exercises.  We discussed though that if worsening symptoms I would like a possible MRI to further evaluate.  Patient is in agreement with the plan and will follow up again in 4 to 6 weeks

## 2020-10-01 NOTE — Assessment & Plan Note (Signed)
Left knee shows the patient did have likely a synovitis that seems to be improving.  Patient has some mild arthritic changes noted of the medial compartment.  Home exercises given.  Topical anti-inflammatories given.  Discussed icing regimen and home exercises.  Patient worsening pain will consider formal physical therapy, injections and bracing.  Follow-up again in 4 to 6 weeks

## 2020-11-04 NOTE — Progress Notes (Signed)
[place] Phone: [phone] Subjective:   Julie [name], am serving as a scribe for Dr. Hulan Saas. This visit occurred during the SARS-CoV-2 public health emergency.  Safety protocols were in place, including screening questions prior to the visit, additional usage of staff PPE, and extensive cleaning of exam room while observing appropriate contact time as indicated for disinfecting solutions.  ' I'm seeing this patient by the request  of:  Patient, [name] Pcp Per (Inactive)  CC: Left knee pain and right wrist pain follow-up  [id number]  10/01/2020 Left knee shows the patient did have likely a synovitis that seems to be improving.  Patient has some mild arthritic changes noted of the medial compartment.  Home exercises given.  Topical anti-inflammatories given.  Discussed icing regimen and home exercises.  Patient worsening pain will consider formal physical therapy, injections and bracing.  Follow-up again in 4 to 6 weeks  Patient has what appears to be somewhat of a tendinitis noted mostly mostly involving the flexor compartments noted.  In addition to this though there is a questionable cyst formation noted.  Increasing in Doppler flow surrounding all the tendons.  Discussed that this could be secondary to the rheumatoid arthritis flare.  Discussed the potential for the cyst.  X-rays are pending.  Discussed with patient about range of motion exercises.  We discussed though that if worsening symptoms I would like a possible MRI to further evaluate.  Patient is in agreement with the plan and will follow up again in 4 to 6 weeks  Update 11/05/2020 Julie [name] is a 60 y.o. female coming in with complaint of R wrist and L knee pain. Has good and bad days with wrist pain. Hurts to make fist. Has tried herbal antiinflammatory which caused increase in her pain.   Knee pain has flared back up despite having [name] trouble  until last week. Wearing knee sleeve for support. Pain localized to medial tibial tubercle.  Patient states that certain areas or touch seems to be very painful.  Patient states walking has been okay but certain movements cause increasing discomfort.  Xray 10/01/2020 R wrist  IMPRESSION: [name] acute abnormality noted.  Xray L knee 10/01/2020 IMPRESSION: Mild degenerative change without acute abnormality.       Past Medical History:  Diagnosis Date   Foot pain, bilateral    Hypercholesteremia    Hypertension    Prediabetes    Rheumatoid arthritis ([place])    Past Surgical History:  Procedure Laterality Date   COLONOSCOPY  03/2012   (2) polyps   Social History   Socioeconomic History   Marital status: Married    Spouse name: Not on file   Number of children: Not on file   Years of education: Not on file   Highest education level: Not on file  Occupational History   Not on file  Tobacco Use   Smoking status: Never   Smokeless tobacco: Never  Vaping Use   Vaping Use: Never used  Substance and Sexual Activity   Alcohol use: [name]    Alcohol/week: 0.0 standard drinks   Drug use: [name]   Sexual activity: Yes    Partners: Male    Birth control/protection: Post-menopausal  Other Topics Concern   Not on file  Social History Narrative   Not on file   Social Determinants of Health   Financial Resource Strain: Not on file  Food Insecurity: Not on file  Transportation  Needs: Not on file  Physical Activity: Not on file  Stress: Not on file  Social Connections: Not on file   Allergies  Allergen Reactions   Zithromax [Azithromycin] Hives   Family History  Problem Relation Age of Onset   Diabetes Mother    Hypertension Mother    Depression Mother    Diabetes Father    Multiple myeloma Father        Current Outpatient Medications (Analgesics):    diclofenac (VOLTAREN) 75 MG EC tablet, Take 75 mg by mouth as needed.  Current Outpatient Medications (Hematological):     Methylcobalamin (METHYL B-12 PO), Take 5,000 mcg by mouth every other day.   Current Outpatient Medications (Other):    Alpha-Lipoic Acid 600 MG CAPS, Take 600 mg by mouth 2 (two) times daily.   Ascorbic Acid (VITAMIN C PO), Take 2,000 mg by mouth 2 (two) times daily.   Barberry-Oreg Grape-Goldenseal (BERBERINE COMPLEX PO), Take by mouth.   Cholecalciferol (VITAMIN D PO), Take 10,000 Int'l Units by mouth daily.   lactobacillus acidophilus (BACID) TABS tablet, Take 2 tablets by mouth daily.   MAGNESIUM PO, Take 400 mg by mouth. 5 days per week   MILK THISTLE PO, Take 600 mg by mouth daily.   minocycline (MINOCIN,DYNACIN) 50 MG capsule, Take by mouth. Takes 172m Mon, Wed, Fri   Omega-3 Fatty Acids (OMEGA-3 FISH OIL PO), Take by mouth.   Reviewed prior external information including notes and imaging from  primary care provider As well as notes that were available from care everywhere and other healthcare systems.  Past medical history, social, surgical and family history all reviewed in electronic medical record.  [name] pertanent information unless stated regarding to the chief complaint.   Review of Systems:  [name] headache, visual changes, nausea, vomiting, diarrhea, constipation, dizziness, abdominal pain, skin rash, fevers, chills, night sweats, weight loss, swollen lymph nodes, body aches, joint swelling, chest pain, shortness of breath, mood changes. POSITIVE muscle aches  Objective  Blood pressure (!) 150/98, pulse 82, height 5' 4"  (1.626 m), weight 170 lb (77.1 kg), last menstrual period 12/02/2012, SpO2 98 %.   General: [name] apparent distress alert and oriented x3 mood and affect normal, dressed appropriately.  HEENT: Pupils equal, extraocular movements intact  Respiratory: Patient's speak in full sentences and does not appear short of breath  Cardiovascular: [name] lower extremity edema, non tender, [name] erythema  Gait normal with good balance and coordination.  MSK:   Patient's left knee  tender to palpation over the medial area.  Patient does have mild positive McMurray's.  [name] significant swelling as much as last time.  He still lacks last 10 degrees of flexion.  Patient right wrist he has some swelling noted.  Mild limited range of motion in certain planes.  Good grip strength.  Negative Tinel's.  Limited musculoskeletal ultrasound was performed and interpreted by ZLyndal Pulley Limited ultrasound shows the patient significantly does not have any of the cellulitis noted again.  Patient does have an acute meniscal tear that appears with increasing Doppler flow But [name] significant displacement of the medial meniscus.  Regarding patient's wrist patient does have significant hypoechoic changes as well as increasing Doppler flow and numerous tendon sheaths of the flexor tendon compartments.  [name] erosive changes of the wrist noted today though.  Impression: Acute synovitis of the right wrist as well as an acute medial meniscal injury.   Impression and Recommendations:     The above documentation has been reviewed  and is accurate and complete Lyndal Pulley, DO

## 2020-11-05 ENCOUNTER — Other Ambulatory Visit: Payer: Self-pay

## 2020-11-05 ENCOUNTER — Encounter: Payer: Self-pay | Admitting: Family Medicine

## 2020-11-05 ENCOUNTER — Ambulatory Visit: Payer: Self-pay

## 2020-11-05 ENCOUNTER — Ambulatory Visit (INDEPENDENT_AMBULATORY_CARE_PROVIDER_SITE_OTHER): Payer: Commercial Managed Care - PPO | Admitting: Family Medicine

## 2020-11-05 VITALS — BP 150/98 | HR 82 | Ht 64.0 in | Wt 170.0 lb

## 2020-11-05 DIAGNOSIS — M25531 Pain in right wrist: Secondary | ICD-10-CM

## 2020-11-05 DIAGNOSIS — M25562 Pain in left knee: Secondary | ICD-10-CM

## 2020-11-05 DIAGNOSIS — M659 Synovitis and tenosynovitis, unspecified: Secondary | ICD-10-CM | POA: Diagnosis not present

## 2020-11-05 DIAGNOSIS — M05731 Rheumatoid arthritis with rheumatoid factor of right wrist without organ or systems involvement: Secondary | ICD-10-CM

## 2020-11-05 DIAGNOSIS — R768 Other specified abnormal immunological findings in serum: Secondary | ICD-10-CM

## 2020-11-05 NOTE — Assessment & Plan Note (Signed)
Patient was diagnosed nearly 8 years ago.  Has not followed up with anyone and will be referred appropriately.  Patient is now having more of a flare.  Initially had nausea synovitis of the knee but now seems to be more of a synovitis of the wrist.

## 2020-11-05 NOTE — Assessment & Plan Note (Signed)
Left knee exam does show the patient does have what appears to be an acute medial meniscal tear but seems to be within the last 3 or 4 weeks.  Hypoechoic changes and increase in Doppler flow noted.  No significant displacement.  Seems to be the anterior medial.  We will continue to monitor.  Encouraged doing certain activities and strengthening.  Follow-up again in 4 to 6 weeks

## 2020-11-05 NOTE — Patient Instructions (Signed)
Referral to rheumatology Dr. Benjamine Mola Try to do diclofenac for 5-7 days send message on Monday  If not better will do prednisone PT [place] See me in 6 weeks

## 2020-11-05 NOTE — Assessment & Plan Note (Signed)
Referred to rheumatology

## 2020-11-05 NOTE — Assessment & Plan Note (Signed)
Patient appears to be having more of a synovitis of the wrist noted on ultrasound today.  Nonspecific hypoechoic changes in multiple different areas noted.  We discussed icing regimen and home exercises, discussed which activities to doing which wants to avoid.  Increase activity slowly.  Follow-up with me again in 6 weeks otherwise.  We did discuss different treatment options including starting the diclofenac which patient will do but we also discussed the possibility of prednisone.  Patient will consider formal physical therapy and will refer patient to rheumatology.

## 2020-11-07 ENCOUNTER — Ambulatory Visit: Payer: Commercial Managed Care - PPO | Attending: Family Medicine | Admitting: Physical Therapy

## 2020-11-07 ENCOUNTER — Encounter: Payer: Self-pay | Admitting: Physical Therapy

## 2020-11-07 ENCOUNTER — Other Ambulatory Visit: Payer: Self-pay

## 2020-11-07 DIAGNOSIS — R262 Difficulty in walking, not elsewhere classified: Secondary | ICD-10-CM | POA: Diagnosis present

## 2020-11-07 DIAGNOSIS — M25562 Pain in left knee: Secondary | ICD-10-CM

## 2020-11-07 NOTE — Patient Instructions (Signed)
Access Code: [email or web address] URL: [email or web address] Date: 11/07/2020 Prepared by: Lum Babe  Exercises Supine Short Arc Quad - 1 x daily - 7 x weekly - 3 sets - 10 reps - 3 hold Supine ITB Stretch with Strap - 1 x daily - 7 x weekly - 3 sets - 5 reps - 20 hold

## 2020-11-07 NOTE — Therapy (Signed)
[facility] [place], [place], [address] Phone: [phone]   Fax:  [phone]  Physical Therapy Evaluation  Patient Details  Name: Julie [name] MRN: [id number] Date of Birth: August 10, 1960 Referring Provider (PT): Creig Hines   Encounter Date: 11/07/2020   PT End of Session - 11/07/20 1600     Visit Number 1    Date for PT Re-Evaluation 02/07/21    PT Start Time 1525    PT Stop Time 1610    PT Time Calculation (min) 45 min    Activity Tolerance Patient tolerated treatment well    Behavior During Therapy [facility] for tasks assessed/performed             Past Medical History:  Diagnosis Date   Foot pain, bilateral    Hypercholesteremia    Hypertension    Prediabetes    Rheumatoid arthritis ([place])     Past Surgical History:  Procedure Laterality Date   COLONOSCOPY  03/2012   (2) polyps    There were no vitals filed for this visit.    Subjective Assessment - 11/07/20 1523     Subjective Patient reports no known injuries, reports that she started having left knee pain in April, reports that she has had issues in the past where it would come and go, but this time the pain was more severe to the point she had a lot of diffiuclty walking.  MD MSK [place] whowed a small non displaced meniscus tear    Limitations Lifting;Sitting    Patient Stated Goals continue yoga and walking, have less pain    Currently in Pain? Yes    Pain Score 0-No pain    Pain Location Knee    Pain Orientation Left    Pain Descriptors / Indicators Sharp    Pain Type Acute pain    Pain Onset More than a month ago    Pain Frequency Intermittent    Aggravating Factors  some times no cause pain up to 8/10    Pain Relieving Factors rest pain can be 0/10    Effect of Pain on Daily Activities difficulty walking, doing yoga                [facility] PT Assessment - 11/07/20 0001       Assessment   Medical Diagnosis left knee pain, meniscus  tear    Referring Provider (PT) Z. Smith    Onset Date/Surgical Date 09/07/20      Precautions   Precautions None      Balance Screen   Has the patient fallen in the past 6 months No    Has the patient had a decrease in activity level because of a fear of falling?  No    Is the patient reluctant to leave their home because of a fear of falling?  No      Home Environment   Additional Comments has stairs, does housework      Prior Function   Level of Independence Independent    Vocation Retired    Leisure walk 57miles 5x/week, 2 yoga classes a week      Observation/Other Assessments-Edema    Edema Circumferential      Circumferential Edema   Circumferential - Right 46.5 CM 4 incehes above superior patella    Circumferential - Left  46cm      ROM / Strength   AROM / PROM / Strength AROM;Strength      AROM  Overall AROM Comments AROM of the left knee WNL's      Strength   Overall Strength Comments left knee extension 4+/5, left knee flexion 4-/5      Palpation   Palpation comment reports very tender medial joint line area      Ambulation/Gait   Gait Comments no device, had on flip flops some decreased control and left thigh looks like less mm tone                        Objective measurements completed on examination: See above findings.                 PT Short Term Goals - 11/07/20 1609       PT SHORT TERM GOAL #1   Title independent with initial HEP    Time 2    Period Weeks    Status New               PT Long Term Goals - 11/07/20 1609       PT LONG TERM GOAL #1   Title no pain with ADL's    Time 8    Period Weeks    Status New      PT LONG TERM GOAL #2   Title resume walking 2 miles and yoga 2x/week without difficulty    Time 8    Period Weeks    Status New      PT LONG TERM GOAL #3   Title go up and down stairs without difficulty    Time 8    Period Weeks    Status New                    Plan  - 11/07/20 1603     Clinical Impression Statement Pateint with left knee pain since April ,she is unsure of an injury, MD MSK US reveals an acute meniscus tear that is non dsisplaced.  She has good ROM, was weaker with flexion than extension, lateral tracking patella, mild ITB tightness, she is very active and wants to return to walking 35miles a day and doing yoga twice a week, I really went over how to resume walking slowly and safely I asked her and explained to her why to avoid any deep squating or hyper flexion with any weight bearing.    Stability/Clinical Decision Making Stable/Uncomplicated    Clinical Decision Making Low    Rehab Potential Good    PT Frequency 1x / week    PT Duration 8 weeks    PT Treatment/Interventions ADLs/Self Care Home Management;Electrical Stimulation;Iontophoresis 4mg /ml Dexamethasone;Gait training;Neuromuscular re-education;Balance training;Therapeutic exercise;Therapeutic activities;Functional mobility training;Stair training;Patient/family education;Manual techniques    PT Next Visit Plan she is to try walking 1 mile a day, and try yoga next week, avoiding hyperflexion moves, we will start strength at our next visit    Consulted and Agree with Plan of Care Patient             Patient will benefit from skilled therapeutic intervention in order to improve the following deficits and impairments:  Abnormal gait, Decreased range of motion, Difficulty walking, Pain, Impaired flexibility, Decreased strength, Decreased mobility  Visit Diagnosis: Acute pain of left knee - Plan: PT plan of care cert/re-cert  Difficulty in walking, not elsewhere classified - Plan: PT plan of care cert/re-cert     Problem List Patient Active Problem List   Diagnosis Date Noted   Right wrist  pain 10/01/2020   Left knee pain 10/01/2020   Prediabetes    Menopause 10/17/2017   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 [place], [place], [address] Phone: [phone]   Fax:  [phone]  Name: Julie [name] MRN: [id number] Date of Birth: [birth year]

## 2020-11-08 ENCOUNTER — Encounter: Payer: Self-pay | Admitting: Physical Therapy

## 2020-11-08 ENCOUNTER — Encounter: Payer: Self-pay | Admitting: Family Medicine

## 2020-11-08 MED ORDER — PREDNISONE 20 MG PO TABS: 20.0000 mg | ORAL_TABLET | Freq: Every day | ORAL | 0 refills | Status: DC

## 2020-11-12 ENCOUNTER — Encounter: Payer: Self-pay | Admitting: Family Medicine

## 2020-11-22 ENCOUNTER — Other Ambulatory Visit: Payer: Self-pay

## 2020-11-22 ENCOUNTER — Ambulatory Visit: Payer: Commercial Managed Care - PPO | Attending: Family Medicine | Admitting: Physical Therapy

## 2020-11-22 ENCOUNTER — Encounter: Payer: Self-pay | Admitting: Physical Therapy

## 2020-11-22 DIAGNOSIS — R262 Difficulty in walking, not elsewhere classified: Secondary | ICD-10-CM | POA: Insufficient documentation

## 2020-11-22 DIAGNOSIS — M25562 Pain in left knee: Secondary | ICD-10-CM | POA: Diagnosis present

## 2020-11-22 NOTE — Therapy (Signed)
**Note De-Identified Julie Obfuscation** [address]. [place], [place], [address] Phone: [phone]   Fax:  [phone]  Physical Therapy Treatment  Patient Details  Name: Julie [name] MRN: [id number] Date of Birth: 1960-08-09 Referring Provider (PT): Creig Hines   Encounter Date: 11/22/2020   PT End of Session - 11/22/20 1159     Visit Number 2    Date for PT Re-Evaluation 02/07/21    PT Start Time 1102    PT Stop Time 1152    PT Time Calculation (min) 50 min    Activity Tolerance Patient tolerated treatment well    Behavior During Therapy [facility] for tasks assessed/performed             Past Medical History:  Diagnosis Date   Foot pain, bilateral    Hypercholesteremia    Hypertension    Prediabetes    Rheumatoid arthritis ([place])     Past Surgical History:  Procedure Laterality Date   COLONOSCOPY  03/2012   (2) polyps    There were no vitals filed for this visit.   Subjective Assessment - 11/22/20 1111     Subjective Feeling okay, has tried walking 1 mile and has gone to Yoga, reports avoided squats and childs pose, reports a little set back due to changing medication    Currently in Pain? No/denies                               [facility] Adult PT Treatment/Exercise - 11/22/20 0001       Exercises   Exercises Knee/Hip      Knee/Hip Exercises: Stretches   Passive Hamstring Stretch Both;4 reps;20 seconds    ITB Stretch Both;4 reps;20 seconds    Piriformis Stretch Both;4 reps;20 seconds      Knee/Hip Exercises: Aerobic   Recumbent Bike level 2 x 5 miniutes    Nustep level 5 x5 minutes      Knee/Hip Exercises: Machines for Strengthening   Cybex Knee Extension 5# 2x10    Cybex Knee Flexion 20# 2x10    Cybex Leg Press 20# 2x10      Knee/Hip Exercises: Standing   Walking with Sports Cord 30# all directions      Knee/Hip Exercises: Supine   Other Supine Knee/Hip Exercises feet on ball K2C, trunk rotation, small  bridges and isometric abs                      PT Short Term Goals - 11/07/20 1609       PT SHORT TERM GOAL #1   Title independent with initial HEP    Time 2    Period Weeks    Status New               PT Long Term Goals - 11/22/20 1201       PT LONG TERM GOAL #1   Title no pain with ADL's    Status On-going      PT LONG TERM GOAL #2   Title resume walking 2 miles and yoga 2x/week without difficulty    Status Partially Met      PT LONG TERM GOAL #3   Title go up and down stairs without difficulty    Status Partially Met                   Plan - 11/22/20 1200     Clinical  Impression Statement Patient doing well, no issuees with returning to activities, still a little tight mild weakness of the hips and the core, added exercxises for this today    PT Next Visit Plan work on core and hips    Consulted and Agree with Plan of Care Patient             Patient will benefit from skilled therapeutic intervention in order to improve the following deficits and impairments:  Abnormal gait, Decreased range of motion, Difficulty walking, Pain, Impaired flexibility, Decreased strength, Decreased mobility  Visit Diagnosis: Acute pain of left knee  Difficulty in walking, not elsewhere classified     Problem List Patient Active Problem List   Diagnosis Date Noted   Right wrist pain 10/01/2020   Left knee pain 10/01/2020   Prediabetes    Menopause 10/17/2017   Subclinical iodine-deficiency hypothyroidism 10/17/2017   Acquired hypothyroidism 04/14/2017   Hyperlipidemia 02/03/2017   Hypertension 02/03/2017   Impaired fasting glucose 02/03/2017   Synovitis of hand 12/27/2013   Multiple pigmented nevi 12/15/2013   Positive ANA (antinuclear antibody) 03/09/2013   Positive anti-CCP test 03/09/2013   OA (osteoarthritis) 02/23/2013   Rheumatoid arthritis ([place]) 02/23/2013   Inflammatory polyarthropathy ([place]) 02/23/2013   Hypertension 12/12/2012    Elevated cholesterol 12/12/2012    Sumner Boast., PT 11/22/2020, 12:02 PM  [address]. [place], [place], [address] Phone: [phone]   Fax:  [phone]  Name: Julie [name] MRN: [id number] Date of Birth: 09-Jun-1960

## 2020-12-04 ENCOUNTER — Other Ambulatory Visit: Payer: Self-pay

## 2020-12-04 ENCOUNTER — Ambulatory Visit: Payer: Commercial Managed Care - PPO

## 2020-12-04 DIAGNOSIS — M25562 Pain in left knee: Secondary | ICD-10-CM | POA: Diagnosis not present

## 2020-12-04 DIAGNOSIS — R262 Difficulty in walking, not elsewhere classified: Secondary | ICD-10-CM

## 2020-12-04 NOTE — Therapy (Signed)
[address]. [place], [place], [address] Phone: [phone]   Fax:  [phone]  Physical Therapy Treatment  Patient Details  Name: Julie [name] MRN: [id number] Date of Birth: 29-Jul-1960 Referring Provider (PT): Creig Hines   Encounter Date: 12/04/2020   PT End of Session - 12/04/20 1521     Visit Number 3    Date for PT Re-Evaluation 02/07/21    PT Start Time 1514    PT Stop Time 1600    PT Time Calculation (min) 46 min    Activity Tolerance Patient tolerated treatment well    Behavior During Therapy [facility] for tasks assessed/performed             Past Medical History:  Diagnosis Date   Foot pain, bilateral    Hypercholesteremia    Hypertension    Prediabetes    Rheumatoid arthritis ([place])     Past Surgical History:  Procedure Laterality Date   COLONOSCOPY  03/2012   (2) polyps    There were no vitals filed for this visit.   Subjective Assessment - 12/04/20 1517     Subjective pain is random. was feeling it more a few days ago. Couldnt do much with yoga today which was discouraging    Currently in Pain? No/denies                               [facility] Adult PT Treatment/Exercise - 12/04/20 0001       Knee/Hip Exercises: Stretches   ITB Stretch Both;20 seconds;1 rep      Knee/Hip Exercises: Aerobic   Recumbent Bike level 2 x 5 miniutes      Knee/Hip Exercises: Machines for Strengthening   Cybex Knee Extension 5# 2x10    Cybex Knee Flexion 20# 2x10    Cybex Leg Press 20# 2x10      Knee/Hip Exercises: Standing   Walking with Sports Cord 30# all directions x 3 each      Knee/Hip Exercises: Supine   Straight Leg Raises Strengthening;Both;10 reps;2 sets   flexion   Other Supine Knee/Hip Exercises red banded bridges x 10. Banded bridge + clam x 10    Other Supine Knee/Hip Exercises feet on ball K2C, trunk rotation, small bridges and isometric abs with ball x 15 each.   Reviewed modified shilds pose in sitting vs supine DKTC with hands behind thighs.                      PT Short Term Goals - 11/07/20 1609       PT SHORT TERM GOAL #1   Title independent with initial HEP    Time 2    Period Weeks    Status New               PT Long Term Goals - 11/22/20 1201       PT LONG TERM GOAL #1   Title no pain with ADL's    Status On-going      PT LONG TERM GOAL #2   Title resume walking 2 miles and yoga 2x/week without difficulty    Status Partially Met      PT LONG TERM GOAL #3   Title go up and down stairs without difficulty    Status Partially Met  Plan - 12/04/20 1523     Clinical Impression Statement Pt reported more knee pain and had to self limit alot of yoga earlier today. Discussed and reinforced safe exercise progression and being mindful of alignment, preventing loading with hyper flexion.  She toelrated all interventions well without increased pain, only with discomfort more so with resisted knee ext machine. She is traveling next week and we discussed small movement breaks as needed for the knee. Plan for next PT visit to increase HEP exercises , review these and provide print out for knee/hip strength so she can continue these.    PT Next Visit Plan work on core and hips. Discussed keeping note of yoga poses that cause pain, may want to go over these in future PT sessions to see if we can modify to prevent pain    Consulted and Agree with Plan of Care Patient             Patient will benefit from skilled therapeutic intervention in order to improve the following deficits and impairments:  Abnormal gait, Decreased range of motion, Difficulty walking, Pain, Impaired flexibility, Decreased strength, Decreased mobility  Visit Diagnosis: Acute pain of left knee  Difficulty in walking, not elsewhere classified     Problem List Patient Active Problem List   Diagnosis Date Noted   Right  wrist pain 10/01/2020   Left knee pain 10/01/2020   Prediabetes    Menopause 10/17/2017   Subclinical iodine-deficiency hypothyroidism 10/17/2017   Acquired hypothyroidism 04/14/2017   Hyperlipidemia 02/03/2017   Hypertension 02/03/2017   Impaired fasting glucose 02/03/2017   Synovitis of hand 12/27/2013   Multiple pigmented nevi 12/15/2013   Positive ANA (antinuclear antibody) 03/09/2013   Positive anti-CCP test 03/09/2013   OA (osteoarthritis) 02/23/2013   Rheumatoid arthritis ([place]) 02/23/2013   Inflammatory polyarthropathy ([place]) 02/23/2013   Hypertension 12/12/2012   Elevated cholesterol 12/12/2012    Hall Busing, PT, DPT 12/04/2020, 4:06 PM  [address]. [place], [place], [address] Phone: [phone]   Fax:  [phone]  Name: Julie [name] MRN: [id number] Date of Birth: June 21, 1960

## 2020-12-06 ENCOUNTER — Encounter: Payer: Self-pay | Admitting: Family Medicine

## 2020-12-06 MED ORDER — DICLOFENAC SODIUM 75 MG PO TBEC: 75.0000 mg | DELAYED_RELEASE_TABLET | ORAL | 1 refills | Status: DC | PRN

## 2020-12-12 ENCOUNTER — Ambulatory Visit: Payer: Commercial Managed Care - PPO | Admitting: Internal Medicine

## 2020-12-15 NOTE — Progress Notes (Signed)
Office Visit Note  Patient: Julie [name]             Date of Birth: 05/31/1960           MRN: [id number]             PCP: Patient, No Pcp Per (Inactive) Referring: Lyndal Pulley, DO Visit Date: 12/16/2020  Subjective:  New Patient (Initial Visit) (Patient was diagnosed with RA in 02/2013 by PCP, then saw Dr. Dossie Der temporarily in [place]. Patient was on PLQ x approximately 2 years. More recently, patient is seeing an integrative doctor in [place] and trying to treat symptoms naturally. Patient complains of right hand and wrist pain, swelling, and stiffness which started 4 months ago. )   History of Present Illness: Venice Marcucci is a 60 y.o. female here for inflammatory arthritis of multiple sites with positive ANA and positive CCP Abs. She was originally diagnosed in 2014 with inflammation of right second and third fingers and left second finger.  She has had concerns with systemic immunosuppression and side effects.  She started treatment with minocycline since 2014 she felt initially her symptoms got significantly worse before she did eventually feel some improvement.  She was also started on hydroxychloroquine that she took for about 2 years but has been off this since 2018.  Throughout this time she is continued intermittent oral NSAID use. Symptoms have been persistently in some amount of exacerbation since around March of this year. She was seen In sports medicine clinic with concern for right wrist synovitis and left knee medial meniscal injury and treatment with oral prednisone and diclofenac.  She did not notice very much change with 20 mg prednisone for 1 week duration. She has been taking diclofenac twice daily  recently due to the increased symptoms, also with right shoulder pain. She notices some stiffness and decreased mobility of her right hand fingers but only swelling in the wrist.  Labs reviewed 03/2018 CCP >200  Imaging reviewed 09/2020 Xray right wrist No acute  injury, no significant arthritis changes 09/2020 Xray left knee Mild osteoarthritis without effusion or erosions or abnormal calcifications  Activities of Daily Living:  Patient reports morning stiffness for 1-60 minutes.   Patient Denies nocturnal pain.  Difficulty dressing/grooming: Denies Difficulty climbing stairs: Denies Difficulty getting out of chair: Denies Difficulty using hands for taps, buttons, cutlery, and/or writing: Reports  Review of Systems  Constitutional:  Negative for fatigue.  HENT:  Negative for mouth sores, mouth dryness and nose dryness.   Eyes:  Negative for pain, itching, visual disturbance and dryness.  Respiratory:  Negative for cough, hemoptysis, shortness of breath and difficulty breathing.   Cardiovascular:  Negative for chest pain, palpitations and swelling in legs/feet.  Gastrointestinal:  Negative for abdominal pain, blood in stool, constipation and diarrhea.  Endocrine: Negative for increased urination.  Genitourinary:  Negative for painful urination.  Musculoskeletal:  Positive for joint pain, joint pain, joint swelling and morning stiffness. Negative for myalgias, muscle weakness, muscle tenderness and myalgias.  Skin:  Negative for color change, rash and redness.  Allergic/Immunologic: Negative for susceptible to infections.  Neurological:  Negative for dizziness, numbness, headaches, memory loss and weakness.  Hematological:  Negative for swollen glands.  Psychiatric/Behavioral:  Negative for confusion and sleep disturbance.    PMFS History:  Patient Active Problem List   Diagnosis Date Noted   High risk medication use 12/16/2020   Right wrist pain 10/01/2020   Left knee pain 10/01/2020   Prediabetes  Menopause 10/17/2017   Subclinical iodine-deficiency hypothyroidism 10/17/2017   Acquired hypothyroidism 04/14/2017   Hyperlipidemia 02/03/2017   Hypertension 02/03/2017   Impaired fasting glucose 02/03/2017   Synovitis of hand 12/27/2013    Multiple pigmented nevi 12/15/2013   Positive ANA (antinuclear antibody) 03/09/2013   Positive anti-CCP test 03/09/2013   OA (osteoarthritis) 02/23/2013   Rheumatoid arthritis (HCC) 02/23/2013   Inflammatory polyarthropathy (HCC) 02/23/2013   Hypertension 12/12/2012   Elevated cholesterol 12/12/2012    Past Medical History:  Diagnosis Date   Foot pain, bilateral    Hypercholesteremia    Hypertension    Prediabetes    Rheumatoid arthritis (HCC)     Family History  Problem Relation Age of Onset   Diabetes Mother    Hypertension Mother    Depression Mother    Diabetes Father    Multiple myeloma Father    Thyroid disease Sister    Hypertension Brother    Rheum arthritis Maternal Grandmother    Rheum arthritis Maternal Uncle    Rheum arthritis Cousin    Past Surgical History:  Procedure Laterality Date   COLONOSCOPY  03/2012   (2) polyps   Social History   Social History Narrative   Not on file   Immunization History  Administered Date(s) Administered   DTaP 11/25/2010   Influenza Inj Mdck Quad Pf 02/14/2019   Influenza, Seasonal, Injecte, Preservative Fre 03/30/2013   PFIZER(Purple Top)SARS-COV-2 Vaccination 08/18/2019, 09/15/2019     Objective: Vital Signs: BP (!) 157/93 (BP Location: Right Arm, Patient Position: Sitting, Cuff Size: Normal)   Pulse 81   Ht 5' 4" (1.626 m)   Wt 171 lb (77.6 kg)   LMP 12/02/2012 (Approximate)   BMI 29.35 kg/m    Physical Exam HENT:     Mouth/Throat:     Mouth: Mucous membranes are moist.     Pharynx: Oropharynx is clear.  Eyes:     Conjunctiva/sclera: Conjunctivae normal.  Cardiovascular:     Rate and Rhythm: Normal rate and regular rhythm.     Comments: Systolic ejection murmur loudest over RUSB Pulmonary:     Effort: Pulmonary effort is normal.     Breath sounds: Normal breath sounds.  Skin:    General: Skin is warm and dry.     Findings: No rash.  Neurological:     Mental Status: She is alert.  Psychiatric:         Mood and Affect: Mood normal.     Musculoskeletal Exam:  Shoulders full ROM no tenderness or swelling Elbows full ROM no tenderness or swelling Wrists full ROM no tenderness or swelling Fingers full ROM with no tenderness or swelling Knees full ROM, no effusions, patellofemoral crepitus of left knee, wearing soft knee wrap Ankles full ROM no tenderness or swelling   CDAI Exam: CDAI Score: 10  Patient Global: 40 mm; Provider Global: 30 mm Swollen: 2 ; Tender: 1  Joint Exam 12/16/2020      Right  Left  Wrist  Swollen Tender  Swollen      Investigation: No additional findings.  Imaging: No results found.  Recent Labs: No results found for: WBC, HGB, PLT, NA, K, CL, CO2, GLUCOSE, BUN, CREATININE, BILITOT, ALKPHOS, AST, ALT, PROT, ALBUMIN, CALCIUM, GFRAA, QFTBGOLD, QFTBGOLDPLUS  Speciality Comments: No specialty comments available.  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  She denies any particular trauma or event as provocation for the current symptoms. She is concerned for side effects of immunosuppression and hair loss. Symptoms are partially controlled with oral NSAIDs and minocycline but recommend additional DMARD treatment to reduce need for long term high dose diclofenac.  Start hydroxychloroquine 200 mg p.o. daily.  Would also consider local injection treatment if focal symptoms remain despite some generalized response.  Positive anti-CCP test Positive ANA (antinuclear antibody)  Highly positive CCP antibody serology for more than 5 years.  Positive ANA but does not present with specific clinical criteria on exam or review of  systems today.  High risk medication use - Plan: Hepatitis B core antibody, IgM, Hepatitis B surface antigen, Hepatitis C antibody, QuantiFERON-TB Gold Plus  Considering increased joint inflammation for several months may require escalation of treatment such as methotrexate or biologic DMARD so we will check baseline serology with hepatitis and TB screening prior to addition of any immunomodulatory medication. Provided patient with information for her optometrist to send eye exam information for hydroxychloroquine screening.  Orders: Orders Placed This Encounter  Procedures   Hepatitis B core antibody, IgM   Hepatitis B surface antigen   Hepatitis C antibody   QuantiFERON-TB Gold Plus   Sedimentation rate    Meds ordered this encounter  Medications   hydroxychloroquine (PLAQUENIL) 200 MG tablet    Sig: Take 1 tablet (200 mg total) by mouth daily.    Dispense:  30 tablet    Refill:  2     Follow-Up Instructions: Return in about 8 weeks (around 02/10/2021) for New pt RA HCQ restart f/u 8wks.   Christopher W Rice, MD  Note - This record has been created using Dragon software.  Chart creation errors have been sought, but may not always  have been located. Such creation errors do not reflect on  the standard of medical care.  

## 2020-12-16 ENCOUNTER — Ambulatory Visit: Payer: Commercial Managed Care - PPO | Admitting: Internal Medicine

## 2020-12-16 ENCOUNTER — Encounter: Payer: Self-pay | Admitting: Internal Medicine

## 2020-12-16 ENCOUNTER — Telehealth: Payer: Self-pay | Admitting: Internal Medicine

## 2020-12-16 ENCOUNTER — Other Ambulatory Visit: Payer: Self-pay

## 2020-12-16 VITALS — BP 157/93 | HR 81 | Ht 64.0 in | Wt 171.0 lb

## 2020-12-16 DIAGNOSIS — Z79899 Other long term (current) drug therapy: Secondary | ICD-10-CM | POA: Diagnosis not present

## 2020-12-16 DIAGNOSIS — M05731 Rheumatoid arthritis with rheumatoid factor of right wrist without organ or systems involvement: Secondary | ICD-10-CM

## 2020-12-16 DIAGNOSIS — R768 Other specified abnormal immunological findings in serum: Secondary | ICD-10-CM | POA: Diagnosis not present

## 2020-12-16 MED ORDER — HYDROXYCHLOROQUINE SULFATE 200 MG PO TABS: 200.0000 mg | ORAL_TABLET | Freq: Every day | ORAL | 2 refills | Status: DC

## 2020-12-16 NOTE — Patient Instructions (Signed)
Hydroxychloroquine tablets What is this medication? HYDROXYCHLOROQUINE (hye drox ee KLOR oh kwin) is used to treat rheumatoidarthritis and systemic lupus erythematosus. It is also used to treat malaria. This medicine may be used for other purposes; ask your health care provider orpharmacist if you have questions. COMMON BRAND NAME(S): Plaquenil, Quineprox What should I tell my care team before I take this medication? They need to know if you have any of these conditions: diabetes eye disease, vision problems G6PD deficiency heart disease history of irregular heartbeat if you often drink alcohol kidney disease liver disease porphyria psoriasis an unusual or allergic reaction to chloroquine, hydroxychloroquine, other medicines, foods, dyes, or preservatives pregnant or trying to get pregnant breast-feeding How should I use this medication? Take this medicine by mouth with a glass of water. Take it as directed on the prescription label. Do not cut, crush or chew this medicine. Swallow the tablets whole. Take it with food. Do not take it more than directed. Take all of this medicine unless your health care provider tells you to stop it early.Keep taking it even if you think you are better. Take products with antacids in them at a different time of day than this medicine. Take this medicine 4 hours before or 4 hours after antacids. Talk toyour health care provider if you have questions. Talk to your pediatrician regarding the use of this medicine in children. Whilethis drug may be prescribed for selected conditions, precautions do apply. Overdosage: If you think you have taken too much of this medicine contact apoison control center or emergency room at once. NOTE: This medicine is only for you. Do not share this medicine with others. What if I miss a dose? If you miss a dose, take it as soon as you can. If it is almost time for yournext dose, take only that dose. Do not take double or extra  doses. What may interact with this medication? Do not take this medicine with any of the following medications: cisapride dronedarone pimozide thioridazine This medicine may also interact with the following medications: ampicillin antacids cimetidine cyclosporine digoxin kaolin medicines for diabetes, like insulin, glipizide, glyburide medicines for seizures like carbamazepine, phenobarbital, phenytoin mefloquine methotrexate other medicines that prolong the QT interval (cause an abnormal heart rhythm) praziquantel This list may not describe all possible interactions. Give your health care provider a list of all the medicines, herbs, non-prescription drugs, or dietary supplements you use. Also tell them if you smoke, drink alcohol, or use illegaldrugs. Some items may interact with your medicine. What should I watch for while using this medication? Visit your health care provider for regular checks on your progress. Tell your health care provider if your symptoms do not start to get better or if they getworse. You may need blood work done while you are taking this medicine. If you take other medicines that can affect heart rhythm, you may need more testing. Talkto your health care provider if you have questions. Your vision may be tested before and during use of this medicine. Tell yourhealth care provider right away if you have any change in your eyesight. This medicine may cause serious skin reactions. They can happen weeks to months after starting the medicine. Contact your health care provider right away if you notice fevers or flu-like symptoms with a rash. The rash may be red or purple and then turn into blisters or peeling of the skin. Or, you might notice a red rash with swelling of the face, lips or lymph nodes   in your neck or underyour arms. If you or your family notice any changes in your behavior, such as new or worsening depression, thoughts of harming yourself, anxiety, or  other unusual or disturbing thoughts, or memory loss, call your health care provider rightaway. What side effects may I notice from receiving this medication? Side effects that you should report to your doctor or health care professionalas soon as possible: allergic reactions (skin rash, itching or hives; swelling of the face, lips, or tongue) changes in vision decreased hearing, ringing in the ears heartbeat rhythm changes (trouble breathing; chest pain; dizziness; fast, irregular heartbeat; feeling faint or lightheaded, falls) liver injury (dark yellow or brown urine; general ill feeling or flu-like symptoms; loss of appetite, right upper belly pain; unusually weak or tired, yellowing of the eyes or skin) low blood sugar (feeling anxious; confusion; dizziness; increased hunger; unusually weak or tired; increased sweating; shakiness; cold, clammy skin; irritable; headache; blurred vision; fast heartbeat; loss of consciousness) low red blood cell counts (trouble breathing; feeling faint; lightheaded, falls; unusually weak or tired) muscle weakness pain, tingling, numbness in the hands or feet rash, fever, and swollen lymph nodes redness, blistering, peeling or loosening of the skin, including inside the mouth suicidal thoughts, mood changes uncontrollable head, mouth, neck, arm, or leg movements unusual bruising or bleeding Side effects that usually do not require medical attention (report to yourdoctor or health care professional if they continue or are bothersome): diarrhea hair loss irritable This list may not describe all possible side effects. Call your doctor for medical advice about side effects. You may report side effects to FDA at1-800-FDA-1088. Where should I keep my medication? Keep out of the reach of children and pets. Store at room temperature up to 30 degrees C (86 degrees F). Protect fromlight. Get rid of any unused medicine after the expiration date. To get rid of medicines  that are no longer needed or have expired: Take the medicine to a medicine take-back program. Check with your pharmacy or law enforcement to find a location. If you cannot return the medicine, check the label or package insert to see if the medicine should be thrown out in the garbage or flushed down the toilet. If you are not sure, ask your health care provider. If it is safe to put it in the trash, empty the medicine out of the container. Mix the medicine with cat litter, dirt, coffee grounds, or other unwanted substance. Seal the mixture in a bag or container. Put it in the trash. NOTE: This sheet is a summary. It may not cover all possible information. If you have questions about this medicine, talk to your doctor, pharmacist, orhealth care provider.  2022 Elsevier/Gold Standard (2019-10-16 15:07:49)  

## 2020-12-16 NOTE — Telephone Encounter (Signed)
Attempted to contact patient, LVM advising PLQ eye exam does not have to be done prior to starting medication just within the first several months of starting.

## 2020-12-16 NOTE — Telephone Encounter (Signed)
Patient calling to let you know her eye doctor cannot do specific eye test for 1 month. Patient does not want to wait that long to start medication. Please call to advise.

## 2020-12-17 ENCOUNTER — Encounter: Payer: Self-pay | Admitting: Family Medicine

## 2020-12-17 ENCOUNTER — Ambulatory Visit: Payer: Commercial Managed Care - PPO | Admitting: Family Medicine

## 2020-12-17 DIAGNOSIS — M064 Inflammatory polyarthropathy: Secondary | ICD-10-CM

## 2020-12-17 DIAGNOSIS — M05731 Rheumatoid arthritis with rheumatoid factor of right wrist without organ or systems involvement: Secondary | ICD-10-CM

## 2020-12-17 NOTE — Assessment & Plan Note (Signed)
Do feel the underlying rheumatoid arthritis is likely contributing.  Discussed with patient at great length about other potential natural supplements.  Discussed the Plaquenil.  We did review the patient's outside records today as well.  Instead of treating each joint individually we will try for more of a systemic approach.  Patient wants to stop with physical therapy and will do the exercises on her own and follow-up with me again in 2 to 3 months.  Total time with patient and reviewing notes as well as previous imaging 31 minutes.

## 2020-12-17 NOTE — Patient Instructions (Signed)
Good to see you.  I think the plaquenil is a good idea Try tart cherry extract '1200mg'$  at night Then if feeling better would maybe stop diclofenac and start turmeric instead at '500mg'$  1-2 times a day  Stay active and do exercises on your own including the shoulder 3 times a week  Ice is your friend Lets check in again in 2-3 months

## 2020-12-17 NOTE — Progress Notes (Signed)
[facility] [place] Phone: [phone] Subjective:   Julie [name], am serving as a scribe for Dr. Hulan Saas.  This visit occurred during the SARS-CoV-2 public health emergency.  Safety protocols were in place, including screening questions prior to the visit, additional usage of staff PPE, and extensive cleaning of exam room while observing appropriate contact time as indicated for disinfecting solutions.   I'm seeing this patient by the request  of:  Patient, [name] Pcp Per (Inactive)  CC: Multiple complaints  VFI:[id number]  11/05/2020 Left knee exam does show the patient does have what appears to be an acute medial meniscal tear but seems to be within the last 3 or 4 weeks.  Hypoechoic changes and increase in Doppler flow noted.  [name] significant displacement.  Seems to be the anterior medial.  We will continue to monitor.  Encouraged doing certain activities and strengthening.  Follow-up again in 4 to 6 weeks  Patient appears to be having more of a synovitis of the wrist noted on ultrasound today.  Nonspecific hypoechoic changes in multiple different areas noted.  We discussed icing regimen and home exercises, discussed which activities to doing which wants to avoid.  Increase activity slowly.  Follow-up with me again in 6 weeks otherwise.  We did discuss different treatment options including starting the diclofenac which patient will do but we also discussed the possibility of prednisone.  Patient will consider formal physical therapy and will refer patient to rheumatology  Patient was diagnosed nearly 8 years ago.  Has not followed up with anyone and will be referred appropriately.  Patient is now having more of a flare.  Initially had nausea synovitis of the knee but now seems to be more of a synovitis of the wrist.  Update 12/17/2020 Julie [name] is a 60 y.o. female coming in with complaint of L knee pain and R wrist pain.  Has seen Dr. Benjamine Mola with rheumatology since last visit.  Confirmed that she does have rheumatoid arthritis.  Patient was started on Plaquenil.  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 Patient noticed popping sensation. Pain has improved over the past few days. Patient has pain some days with brace and pain is not present on other days.   Wrist pain is same as last visit as well. Pain some days but not others. Patient has been mindful of using contralateral hand to provide a rest for R wrist. Has had some shooting pain that radiates up into forearm.   Patient is also having R shoulder pain since last visit pain has increased. Pain with ab and adduction. Pain over anterior and middle deltoid. Pain with cutting lettuce and melons. Pain worse in evenings.   Patient has been doing physical therapy once a week to every other week. Feels like she needs to do more than this for success.        Past Medical History:  Diagnosis Date   Foot pain, bilateral    Hypercholesteremia    Hypertension    Prediabetes    Rheumatoid arthritis ([place])    Past Surgical History:  Procedure Laterality Date   COLONOSCOPY  03/2012   (2) polyps   Social History   Socioeconomic History   Marital status: Married    Spouse name: Not on file   Number of children: Not on file   Years of education: Not  on file   Highest education level: Not on file  Occupational History   Not on file  Tobacco Use   Smoking status: Never   Smokeless tobacco: Never  Vaping Use   Vaping Use: Never used  Substance and Sexual Activity   Alcohol use: [name]    Alcohol/week: 0.0 standard drinks   Drug use: [name]   Sexual activity: Yes    Partners: Male    Birth control/protection: Post-menopausal  Other Topics Concern   Not on file  Social History Narrative   Not on file   Social Determinants of Health   Financial Resource Strain: Not on file  Food  Insecurity: Not on file  Transportation Needs: Not on file  Physical Activity: Not on file  Stress: Not on file  Social Connections: Not on file   Allergies  Allergen Reactions   Zithromax [Azithromycin] Hives   Family History  Problem Relation Age of Onset   Diabetes Mother    Hypertension Mother    Depression Mother    Diabetes Father    Multiple myeloma Father    Thyroid disease Sister    Hypertension Brother    Rheum arthritis Maternal Grandmother    Rheum arthritis Maternal Uncle    Rheum arthritis Cousin     Current Outpatient Medications (Endocrine & Metabolic):    predniSONE (DELTASONE) 20 MG tablet, Take 1 tablet (20 mg total) by mouth daily with breakfast.    Current Outpatient Medications (Analgesics):    diclofenac (VOLTAREN) 75 MG EC tablet, Take 1 tablet (75 mg total) by mouth as needed.  Current Outpatient Medications (Hematological):    Methylcobalamin (METHYL B-12 PO), Take 5,000 mcg by mouth every other day.  Current Outpatient Medications (Other):    Alpha-Lipoic Acid 600 MG CAPS, Take 600 mg by mouth 2 (two) times daily.   Ascorbic Acid (VITAMIN C PO), Take 2,000 mg by mouth 2 (two) times daily.   Barberry-Oreg Grape-Goldenseal (BERBERINE COMPLEX PO), Take by mouth.   beta carotene 25000 UNIT capsule, Take 20,000 Units by mouth once a week.   Cholecalciferol (VITAMIN D PO), Take 10,000 Int'l Units by mouth daily.   hydroxychloroquine (PLAQUENIL) 200 MG tablet, Take 1 tablet (200 mg total) by mouth daily.   lactobacillus acidophilus (BACID) TABS tablet, Take 2 tablets by mouth daily.   MAGNESIUM PO, Take 400 mg by mouth. 5 days per week   MILK THISTLE PO, Take 600 mg by mouth daily.   minocycline (MINOCIN,DYNACIN) 50 MG capsule, Take by mouth. Takes 193m Mon, Wed, Fri   Omega-3 Fatty Acids (OMEGA-3 FISH OIL PO), Take by mouth.   Reviewed prior external information including notes and imaging from  primary care provider As well as notes that were  available from care everywhere and other healthcare systems.  Past medical history, social, surgical and family history all reviewed in electronic medical record.  [name] pertanent information unless stated regarding to the chief complaint.   Review of Systems:  [name] headache, visual changes, nausea, vomiting, diarrhea, constipation, dizziness, abdominal pain, skin rash, fevers, chills, night sweats, weight loss, swollen lymph nodes, body aches, joint swelling, chest pain, shortness of breath, mood changes. POSITIVE muscle aches  Objective  Blood pressure (!) 132/102, pulse 79, height _0  (1.626 m), weight 173 lb (78.5 kg), last menstrual period 12/02/2012, SpO2 94 %.   General: [name] apparent distress alert and oriented x3 mood and affect normal, dressed appropriately.  HEENT: Pupils equal, extraocular movements intact  Respiratory: Patient's speak in  full sentences and does not appear short of breath  Cardiovascular: [name] lower extremity edema, non tender, [name] erythema  Gait normal with good balance and coordination.  MSK: Patient still has swelling in multiple different joints.  Still has swelling of the wrist bilaterally right greater than left.  Patient does have some very mild swelling of the fingers noted as well. Patient's left knee does have some crepitus noted.  Does have tightness noted of the quadricep in the hamstring somewhat.  [name] significant instability noted.   Impression and Recommendations:     The above documentation has been reviewed and is accurate and complete Lyndal Pulley, DO

## 2020-12-17 NOTE — Assessment & Plan Note (Signed)
Seeing rheum  Need DMARD No erosive changes RTC in 6 weeks

## 2020-12-18 LAB — QUANTIFERON-TB GOLD PLUS
Mitogen-NIL: 8.31 IU/mL
NIL: 0.03 IU/mL
QuantiFERON-TB Gold Plus: NEGATIVE
TB1-NIL: 0 IU/mL
TB2-NIL: 0 IU/mL

## 2020-12-18 LAB — HEPATITIS C ANTIBODY
Hepatitis C Ab: NONREACTIVE
SIGNAL TO CUT-OFF: 0.01 (ref <1.00)

## 2020-12-18 LAB — SEDIMENTATION RATE: Sed Rate: 22 mm/h (ref 0–30)

## 2020-12-18 LAB — HEPATITIS B SURFACE ANTIGEN: Hepatitis B Surface Ag: NONREACTIVE

## 2020-12-18 LAB — HEPATITIS B CORE ANTIBODY, IGM: Hep B C IgM: NONREACTIVE

## 2020-12-20 ENCOUNTER — Ambulatory Visit: Payer: Commercial Managed Care - PPO | Attending: Family Medicine

## 2020-12-20 ENCOUNTER — Other Ambulatory Visit: Payer: Self-pay

## 2020-12-20 DIAGNOSIS — M25562 Pain in left knee: Secondary | ICD-10-CM | POA: Insufficient documentation

## 2020-12-20 DIAGNOSIS — R262 Difficulty in walking, not elsewhere classified: Secondary | ICD-10-CM | POA: Diagnosis present

## 2020-12-20 NOTE — Therapy (Signed)
[facility] [place]. [place], [place], [address] Phone: [phone]   Fax:  [phone]  Physical Therapy Treatment  Patient Details  Name: Julie [name] MRN: [id number] Date of Birth: Aug 29, 1960 Referring Provider (PT): Creig Hines   Encounter Date: 12/20/2020   PT End of Session - 12/20/20 0926     Visit Number 4    Date for PT Re-Evaluation 02/07/21    PT Start Time 0919    PT Stop Time 1000    PT Time Calculation (min) 41 min    Activity Tolerance Patient tolerated treatment well    Behavior During Therapy [facility] for tasks assessed/performed             Past Medical History:  Diagnosis Date   Foot pain, bilateral    Hypercholesteremia    Hypertension    Prediabetes    Rheumatoid arthritis ([place])     Past Surgical History:  Procedure Laterality Date   COLONOSCOPY  03/2012   (2) polyps    There were no vitals filed for this visit.   Subjective Assessment - 12/20/20 0923     Subjective pain can be a nuisance in the knee and has been on and off. also limited in some of the exercises d/t right shoulder pain (did mention this to MD when last saw them). Has started a new medication for RA hoping that helps.    Patient Stated Goals continue yoga and walking, have less pain    Currently in Pain? No/denies   "i am aware of it"   Pain Location Knee    Pain Orientation Left                               [facility] Adult PT Treatment/Exercise - 12/20/20 0001       Knee/Hip Exercises: Aerobic   Nustep Level 5 x 5 minutes      Knee/Hip Exercises: Seated   Long Arc Quad Strengthening;Left;2 sets;10 reps   3" with ball squeeze for VMO     Knee/Hip Exercises: Supine   Straight Leg Raises Strengthening;Both;10 reps;2 sets   flexion, 3" holds   Straight Leg Raises Limitations sidelying SLR for abduction LLE 2 x 10 with slight extension. prone hip ext 3" 2 x10    Other Supine Knee/Hip Exercises  Banded bridge + clam 2 x 10, red TB             ITB stretch 20" x 2 with trunk flexion , x 1 trialed with overhead reach, also trialed sidelying with left leg hanging off       PT Education - 12/20/20 1001     Education Details Updated HEP provided Access Code: [email or web address]. Use of ice for knee as needed    Person(s) Educated Patient              PT Short Term Goals - 12/20/20 1006       PT SHORT TERM GOAL #1   Title independent with initial HEP    Time 2    Period Weeks    Status Achieved               PT Long Term Goals - 11/22/20 1201       PT LONG TERM GOAL #1   Title no pain with ADL's    Status On-going      PT LONG TERM GOAL #2   Title  resume walking 2 miles and yoga 2x/week without difficulty    Status Partially Met      PT LONG TERM GOAL #3   Title go up and down stairs without difficulty    Status Partially Met                   Plan - 12/20/20 1002     Clinical Impression Statement Todays session focused on updating home exercise program for isolated mms strength to help support knee and LLE as pt continues a more active lifestyle. She reports exercises from previous PT session tolerated nicely without pain post session. She does report that she has decreased her activity over past 2 weeks d/t flareups of pain on and off which limit her ability to complete yoga activities, walks. We discussed working on these updated exercises that were introduced today and tolerated well altough with quad, hip abductor and HS fatigue noted with increased reps. We discussed safe self progressions as exercises become a bit easier. Pt reports it is difficult for her to determine if things are getting better - plan to complete new HEP more frequently until next appointment and we will follow up further then about how things are going and if she has been able to return to activities.    PT Next Visit Plan work on core and hips. Discussed keeping note of yoga  poses that cause pain, may want to go over these in future PT sessions to see if we can modify to prevent pain. Reassess tolerance to new HEP and progress as needed. Plan to determine going forward 1x/wk vs discharge planning    Consulted and Agree with Plan of Care Patient             Patient will benefit from skilled therapeutic intervention in order to improve the following deficits and impairments:  Abnormal gait, Decreased range of motion, Difficulty walking, Pain, Impaired flexibility, Decreased strength, Decreased mobility  Visit Diagnosis: Acute pain of left knee  Difficulty in walking, not elsewhere classified     Problem List Patient Active Problem List   Diagnosis Date Noted   High risk medication use 12/16/2020   Right wrist pain 10/01/2020   Left knee pain 10/01/2020   Prediabetes    Menopause 10/17/2017   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 [place], [place], [address] Phone: [phone]   Fax:  [phone]  Name: Ivan Maskell MRN: [id number] Date of Birth: 28-Jun-1960

## 2020-12-20 NOTE — Patient Instructions (Signed)
Access Code: [email or web address] URL: [email or web address] Date: 12/20/2020 Prepared by: Sherlynn Stalls  Exercises Seated Long Arc Quad with Hip Adduction - 1 x daily - 7 x weekly - 2-3 sets - 10 reps - 3 second hold Bridge with Hip Abduction and Resistance - 1 x daily - 7 x weekly - 2-3 sets - 10 reps Supine Active Straight Leg Raise - 1 x daily - 7 x weekly - 2-3 sets - 10 reps - 3 seconds hold Sidelying Hip Extension in Abduction - 1 x daily - 7 x weekly - 2-3 sets - 10 reps Prone Hip Extension with Plantarflexion - 1 x daily - 7 x weekly - 2-3 sets - 10 reps - 3 seconds hold Sidelying ITB Stretch off Table - 1 x daily - 7 x weekly - 4 sets - 20-30 seconds hold Standing ITB Stretch - 1 x daily - 7 x weekly - 4 sets - 20-30 seconds hold

## 2020-12-24 ENCOUNTER — Encounter: Payer: Self-pay | Admitting: Internal Medicine

## 2020-12-25 ENCOUNTER — Other Ambulatory Visit: Payer: Self-pay | Admitting: Internal Medicine

## 2020-12-25 MED ORDER — DICLOFENAC SODIUM 75 MG PO TBEC: 75.0000 mg | DELAYED_RELEASE_TABLET | Freq: Two times a day (BID) | ORAL | 1 refills | Status: DC | PRN

## 2020-12-25 NOTE — Telephone Encounter (Signed)
That's correct, would just start with 400 mg x5 days per week.

## 2020-12-25 NOTE — Telephone Encounter (Signed)
My thought process for 1 daily was less likely to cause any intolerance effects and can increase if getting along with it. Also, her body weight is 78 kg and the maximum recommended dosing for RA is <5 mg/kg so 2 tablets twice daily every day would be slightly above this. She could increase this to taking for example 2 tablets x5 days weekly but would not recommend every day, if she is okay changing to that. I can send a new Rx for diclofenac is no problem 75 mg BID for now so she has this available.

## 2020-12-25 NOTE — Progress Notes (Signed)
Lab results are negative for hepatitis or TB screening so would not be a contraindication to alternative medicines if needed.

## 2020-12-30 ENCOUNTER — Ambulatory Visit: Payer: Commercial Managed Care - PPO

## 2020-12-30 ENCOUNTER — Other Ambulatory Visit: Payer: Self-pay

## 2020-12-30 DIAGNOSIS — M25562 Pain in left knee: Secondary | ICD-10-CM | POA: Diagnosis not present

## 2020-12-30 DIAGNOSIS — R262 Difficulty in walking, not elsewhere classified: Secondary | ICD-10-CM

## 2020-12-30 NOTE — Therapy (Signed)
[place]. [place], [place], [address] Phone: [phone]   Fax:  [phone]  Physical Therapy Treatment  Patient Details  Name: Julie [name] MRN: [id number] Date of Birth: 16-Apr-1961 Referring Provider (PT): Creig Hines   Encounter Date: 12/30/2020   PT End of Session - 12/30/20 1053     Visit Number 5    Date for PT Re-Evaluation 02/07/21    PT Start Time 1000    PT Stop Time 1046    PT Time Calculation (min) 46 min    Activity Tolerance Patient limited by pain;Patient tolerated treatment well    Behavior During Therapy [facility] for tasks assessed/performed             Past Medical History:  Diagnosis Date   Foot pain, bilateral    Hypercholesteremia    Hypertension    Prediabetes    Rheumatoid arthritis ([place])     Past Surgical History:  Procedure Laterality Date   COLONOSCOPY  03/2012   (2) polyps    There were no vitals filed for this visit.   Subjective Assessment - 12/30/20 1005     Subjective Pain is not any better, more popping. Is also limiting day to day even walking. not sure what to do.    Patient Stated Goals continue yoga and walking, have less pain    Currently in Pain? No/denies   not at current moment, 2/10 when walking/popping   Pain Location Knee    Pain Orientation Left                [facility] PT Assessment - 12/30/20 0001       Flexibility   Soft Tissue Assessment /Muscle Length yes    Hamstrings mild tightness proximal and distal HS, distal HS/popliteal angle limited by knee pain.      Palpation   Patella mobility mild hypomobility    Palpation comment continued to have tenderness at medial joint line moreso when feeling pop or in seated position, tender.                           [facility] PT Treatment/Exercise - 12/30/20 0001       Knee/Hip Exercises: Seated   Other Seated Knee/Hip Exercises ankle DF 2 x 10 yellow TB  - 1st set knee  resting in short sitting, 2nd set leg resting in extended position, supported on PT leg.    Hamstring Curl Left;2 sets;10 reps   yellow TB resistance.     Knee/Hip Exercises: Supine   Quad Sets Limitations 5" x 10 reps    Straight Leg Raises Strengthening;Left;2 sets;10 reps   with quad set     Manual Therapy   Manual therapy comments gentle patellar mobs, KT tape applicatio 2 I strps anterior thigh crossing below patella - knee pain relief technique                    PT Education - 12/30/20 1256     Education Details Education provided regarding indications for KTape application and potential benefits, also discussed proper/safe removal techniques saturating tape with an oil/conditioner and slowly removing to protect skin. Spoke in depth about potential benefit from skilled PT to work on improving strength and mobility support to facilitate function, importance of continuing HEP with modifications to work on strength and mobility while not increasing pain at knee or shoulder (ie standing hip extensions with straight  leg instead of prone). Educated pt also that given current status and frustration with pain and function, would benefit from following up with MD.    Terence Lux) Educated Patient    Methods Explanation;Demonstration    Comprehension Verbalized understanding              PT Short Term Goals - 12/20/20 1006       PT SHORT TERM GOAL #1   Title independent with initial HEP    Time 2    Period Weeks    Status Achieved               PT Long Term Goals - 11/22/20 1201       PT LONG TERM GOAL #1   Title no pain with ADL's    Status On-going      PT LONG TERM GOAL #2   Title resume walking 2 miles and yoga 2x/week without difficulty    Status Partially Met      PT LONG TERM GOAL #3   Title go up and down stairs without difficulty    Status Partially Met                   Plan - 12/30/20 1054     Clinical Impression Statement Pt arrived  to session today expressing frustration regarding status of her knee, reports popping can be more frequent and continues to be a nuisance, and she has stopped doing regular activities d/t pain. enters today with a compression wrap on the left knee and report this helps some. assessed HS tightness and mild tightness especially at distal HS noted but also limited in stretching d/t medial knee pain.   gentle exercises completed today within tolerance but much of session spent on education. Reinforced HEP for LE strength and mobility, prevent any disuse weakness, and pt educated in modifications for positioning to avoid shoulder pain exacerbations. We discussed potential help of adjunct to exercises such as therapy taping, pt VU and was in agreement to trial this,  trialed therapeutic kinesiotaping to the knee at end of session and she did report she may be taking it off soon. Discussed in depth today appropriateness for follow up with MD vs potential consult with ortho given continued knee pain and reports of decreased function.    PT Next Visit Plan recommend pt follow up again with referring MD,may benefit from ortho consult. Discussed with pt options of continuing PT 1x/wk to work on specific strength and flexibility or discharge from PT given her current status and concerns.  Pt reports back she will think on this and call back.    Consulted and Agree with Plan of Care Patient             Patient will benefit from skilled therapeutic intervention in order to improve the following deficits and impairments:  Abnormal gait, Decreased range of motion, Difficulty walking, Pain, Impaired flexibility, Decreased strength, Decreased mobility  Visit Diagnosis: Acute pain of left knee  Difficulty in walking, not elsewhere classified     Problem List Patient Active Problem List   Diagnosis Date Noted   High risk medication use 12/16/2020   Right wrist pain 10/01/2020   Left knee pain 10/01/2020    Prediabetes    Menopause 10/17/2017   Subclinical iodine-deficiency hypothyroidism 10/17/2017   Acquired hypothyroidism 04/14/2017   Hyperlipidemia 02/03/2017   Hypertension 02/03/2017   Impaired fasting glucose 02/03/2017   Synovitis of hand 12/27/2013   Multiple pigmented nevi  12/15/2013   Positive ANA (antinuclear antibody) 03/09/2013   Positive anti-CCP test 03/09/2013   OA (osteoarthritis) 02/23/2013   Rheumatoid arthritis ([place]) 02/23/2013   Inflammatory polyarthropathy ([place]) 02/23/2013   Hypertension 12/12/2012   Elevated cholesterol 12/12/2012    Hall Busing, PT, DPT 12/30/2020, 1:18 PM  [place]. [place], [place], [address] Phone: [phone]   Fax:  [phone]  Name: Raylyn Carton MRN: [id number] Date of Birth: 21-Jul-1960

## 2021-02-09 NOTE — Progress Notes (Signed)
Office Visit Note  Patient: Julie [name]             Date of Birth: 08-25-1960           MRN: [id number]             PCP: Patient, No Pcp Per (Inactive) Referring: No ref. provider found Visit Date: 02/10/2021   Subjective:  Follow-up (Doing good)   History of Present Illness: Julie [name] is a 60 y.o. female here for follow up for seropositive rheumatoid arthritis after restarting hydroxychloroquine 400 mg PO Monday-Friday after initial visit 8 weeks ago. Baseline labs obtained at initial visit showing normal sedimentation rate and negative for hepatitis and TB screening.  Starting the medicine she noticed a few areas of improvement.  Swelling has decreased on the radial aspect of the wrist though swelling continues on the ulnar side and down to the fifth finger.  She is no longer having pain radiating up from the wrist or triggering of her fingers.  Morning stiffness is decreased.  She also feels decreased pain and stiffness in the right shoulder and in bilateral knees.  She denies any problems taking the medication she tried increasing it to 10 tablets/week but went back down to once daily due to ease.  Previous HPI 12/16/20 Julie [name] is a 60 y.o. female here for inflammatory arthritis of multiple sites with positive ANA and positive CCP Abs. She was originally diagnosed in 2014 with inflammation of right second and third fingers and left second finger.  She has had concerns with systemic immunosuppression and side effects.  She started treatment with minocycline since 2014 she felt initially her symptoms got significantly worse before she did eventually feel some improvement.  She was also started on hydroxychloroquine that she took for about 2 years but has been off this since 2018.  Throughout this time she is continued intermittent oral NSAID use. Symptoms have been persistently in some amount of exacerbation since around March of this year. She was seen In sports medicine clinic  with concern for right wrist synovitis and left knee medial meniscal injury and treatment with oral prednisone and diclofenac.  She did not notice very much change with 20 mg prednisone for 1 week duration. She has been taking diclofenac twice daily  recently due to the increased symptoms, also with right shoulder pain. She notices some stiffness and decreased mobility of her right hand fingers but only swelling in the wrist.   Labs reviewed 03/2018 CCP >200   Imaging reviewed 09/2020 Xray right wrist No acute injury, no significant arthritis changes 09/2020 Xray left knee Mild osteoarthritis without effusion or erosions or abnormal calcifications   Review of Systems  Constitutional:  Negative for fatigue.  HENT:  Negative for mouth dryness.   Eyes:  Negative for dryness.  Respiratory:  Negative for shortness of breath.   Cardiovascular:  Negative for swelling in legs/feet.  Gastrointestinal:  Positive for constipation.  Endocrine: Negative for excessive thirst.  Genitourinary:  Negative for difficulty urinating.  Musculoskeletal:  Positive for joint pain, joint pain, joint swelling and morning stiffness.  Skin:  Negative for rash.  Allergic/Immunologic: Negative for susceptible to infections.  Neurological:  Negative for numbness.  Hematological:  Negative for bruising/bleeding tendency.  Psychiatric/Behavioral:  Negative for sleep disturbance.    PMFS History:  Patient Active Problem List   Diagnosis Date Noted   High risk medication use 12/16/2020   Right wrist pain 10/01/2020   Left knee pain 10/01/2020  Prediabetes    Menopause 10/17/2017   Subclinical iodine-deficiency hypothyroidism 10/17/2017   Acquired hypothyroidism 04/14/2017   Hyperlipidemia 02/03/2017   Hypertension 02/03/2017   Impaired fasting glucose 02/03/2017   Synovitis of hand 12/27/2013   Multiple pigmented nevi 12/15/2013   Positive ANA (antinuclear antibody) 03/09/2013   Positive anti-CCP test  03/09/2013   OA (osteoarthritis) 02/23/2013   Rheumatoid arthritis ([place]) 02/23/2013   Inflammatory polyarthropathy ([place]) 02/23/2013   Hypertension 12/12/2012   Elevated cholesterol 12/12/2012    Past Medical History:  Diagnosis Date   Foot pain, bilateral    Hypercholesteremia    Hypertension    Prediabetes    Rheumatoid arthritis ([place])     Family History  Problem Relation Age of Onset   Diabetes Mother    Hypertension Mother    Depression Mother    Diabetes Father    Multiple myeloma Father    Thyroid disease Sister    Hypertension Brother    Rheum arthritis Maternal Grandmother    Rheum arthritis Maternal Uncle    Rheum arthritis Cousin    Past Surgical History:  Procedure Laterality Date   COLONOSCOPY  03/2012   (2) polyps   Social History   Social History Narrative   Not on file   Immunization History  Administered Date(s) Administered   DTaP 11/25/2010   Influenza Inj Mdck Quad Pf 02/14/2019   Influenza, Seasonal, Injecte, Preservative Fre 03/30/2013   PFIZER(Purple Top)SARS-COV-2 Vaccination 08/18/2019, 09/15/2019, 04/08/2020, 09/02/2020     Objective: Vital Signs: BP (!) 171/86 (BP Location: Left Arm, Patient Position: Sitting, Cuff Size: Normal)   Pulse 87   Resp 16   Ht 5' 4.5" (1.638 m)   Wt 174 lb (78.9 kg)   LMP 12/02/2012 (Approximate)   BMI 29.41 kg/m    Physical Exam Eyes:     Conjunctiva/sclera: Conjunctivae normal.  Musculoskeletal:     Right lower leg: No edema.     Left lower leg: No edema.  Skin:    General: Skin is warm and dry.     Findings: No rash.  Neurological:     Mental Status: She is alert.  Psychiatric:        Mood and Affect: Mood normal.     Musculoskeletal Exam:  Elbows full ROM no tenderness or swelling Right wrist swelling over ulnar half of the joint with no tenderness to palpation range of motion is slightly decreased Palpable ROM of at fifth MCP flexor tendon without triggering or palpable nodule, other  fingers normal range of motion without swelling Knees full ROM no tenderness or swelling   CDAI Exam: CDAI Score: 6  Patient Global: 30 mm; Provider Global: 20 mm Swollen: 1 ; Tender: 0  Joint Exam 02/10/2021      Right  Left  Wrist  Swollen         Investigation: No additional findings.  Imaging: [place] LIMITED JOINT SPACE STRUCTURES UP LEFT(NO LINKED CHARGES)  Result Date: 02/23/2021 Limited muscular skeletal ultrasound was performed and interpreted by Hulan Saas, M Limited ultrasound of the right wrist shows the patient does have hypoechoic changes still noted of the superficial activity likely more of a tendinitis.  Patient also has some neovascularization and increasing Doppler flow significantly in the tendons himself.   Left knee shows the patient does have a significant synovitis noted but no true effusion noted of the knee.   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 Low disease activity currently, right wrist is swollen but without severe pain or stiffness. Continue HCQ 200 mg PO daily and diclofenac 75 mg PO daily PRN. Can f/u in 71mo on current regimen.  High risk medication use  Tolerating hydroxychloroquine at the 200 mg dose. Discussed as needed use of diclofenac and importance to take with food. Blood pressure is somewhat above goal so would also benefit with PRN rather than maximum, scheduled dosing.  Orders: No orders of the defined types were placed in  this encounter.  Meds ordered this encounter  Medications   hydroxychloroquine (PLAQUENIL) 200 MG tablet    Sig: Take 1 tablet (200 mg total) by mouth daily.    Dispense:  90 tablet    Refill:  1   diclofenac (VOLTAREN) 75 MG EC tablet    Sig: Take 1 tablet (75 mg total) by mouth daily as needed for moderate pain.    Dispense:  90 tablet    Refill:  1     Follow-Up Instructions: Return in about 6 months (around 08/11/2021) for Seropositive RA on HCQ f/u 678mo   ChCollier SalinaMD  Note - This record has been created using [employer] Chart creation errors have been sought, but may not always  have been located. Such creation errors do not reflect on  the standard of medical care.

## 2021-02-10 ENCOUNTER — Ambulatory Visit: Payer: Commercial Managed Care - PPO | Admitting: Internal Medicine

## 2021-02-10 ENCOUNTER — Encounter: Payer: Self-pay | Admitting: Internal Medicine

## 2021-02-10 ENCOUNTER — Other Ambulatory Visit: Payer: Self-pay

## 2021-02-10 VITALS — BP 171/86 | HR 87 | Resp 16 | Ht 64.5 in | Wt 174.0 lb

## 2021-02-10 DIAGNOSIS — M05731 Rheumatoid arthritis with rheumatoid factor of right wrist without organ or systems involvement: Secondary | ICD-10-CM

## 2021-02-10 DIAGNOSIS — Z79899 Other long term (current) drug therapy: Secondary | ICD-10-CM

## 2021-02-10 MED ORDER — DICLOFENAC SODIUM 75 MG PO TBEC: 75.0000 mg | DELAYED_RELEASE_TABLET | Freq: Every day | ORAL | 1 refills | Status: DC | PRN

## 2021-02-10 MED ORDER — HYDROXYCHLOROQUINE SULFATE 200 MG PO TABS: 200.0000 mg | ORAL_TABLET | Freq: Every day | ORAL | 1 refills | Status: DC

## 2021-02-17 NOTE — Progress Notes (Signed)
Julie [name] Phone: [phone] Subjective:   [name], am serving as a scribe for Dr. Hulan Saas.  This visit occurred during the SARS-CoV-2 public health emergency.  Safety protocols were in place, including screening questions prior to the visit, additional usage of staff PPE, and extensive cleaning of exam room while observing appropriate contact time as indicated for disinfecting solutions.     CC: Multiple joint complaints follow-up  [id number]  12/17/2020 Do feel the underlying rheumatoid arthritis is likely contributing.  Discussed with patient at great length about other potential natural supplements.  Discussed the Plaquenil.  We did review the patient's outside records today as well.  Instead of treating each joint individually we will try for more of a systemic approach.  Patient wants to stop with physical therapy and will do the exercises on her own and follow-up with me again in 2 to 3 months.  Total time with patient and reviewing notes as well as previous imaging 31 minutes.  Update 02/18/2021 Julie [name] is a 60 y.o. female coming in with complaint of R knee and L wrist pain. Patient states that she has had some improvement since last visit. Patient is taking medication that has improved pain. Physical therapy was not improving symptoms.  Patient has now taken a step back where patient has not been quite as active with playing the panel, doing crocheting, feels like this is helped though at the moment.      Past Medical History:  Diagnosis Date   Foot pain, bilateral    Hypercholesteremia    Hypertension    Prediabetes    Rheumatoid arthritis ([place])    Past Surgical History:  Procedure Laterality Date   COLONOSCOPY  03/2012   (2) polyps   Social History   Socioeconomic History   Marital status: Married    Spouse name: Not on file   Number of children: Not on file   Years of  education: Not on file   Highest education level: Not on file  Occupational History   Not on file  Tobacco Use   Smoking status: Never   Smokeless tobacco: Never  Vaping Use   Vaping Use: Never used  Substance and Sexual Activity   Alcohol use: No    Alcohol/week: 0.0 standard drinks   Drug use: No   Sexual activity: Yes    Partners: Male    Birth control/protection: Post-menopausal  Other Topics Concern   Not on file  Social History Narrative   Not on file   Social Determinants of Health   Financial Resource Strain: Not on file  Food Insecurity: Not on file  Transportation Needs: Not on file  Physical Activity: Not on file  Stress: Not on file  Social Connections: Not on file   Allergies  Allergen Reactions   Zithromax [Azithromycin] Hives   Family History  Problem Relation Age of Onset   Diabetes Mother    Hypertension Mother    Depression Mother    Diabetes Father    Multiple myeloma Father    Thyroid disease Sister    Hypertension Brother    Rheum arthritis Maternal Grandmother    Rheum arthritis Maternal Uncle    Rheum arthritis Cousin     Current Outpatient Medications (Endocrine & Metabolic):    predniSONE (DELTASONE) 20 MG tablet, Take 1 tablet (20 mg total) by mouth daily with breakfast.    Current Outpatient Medications (Analgesics):  diclofenac (VOLTAREN) 75 MG EC tablet, Take 1 tablet (75 mg total) by mouth daily as needed for moderate pain.  Current Outpatient Medications (Hematological):    Methylcobalamin (METHYL B-12 PO), Take 5,000 mcg by mouth every other day.  Current Outpatient Medications (Other):    Alpha-Lipoic Acid 600 MG CAPS, Take 600 mg by mouth 2 (two) times daily.   Ascorbic Acid (VITAMIN C PO), Take 2,000 mg by mouth 2 (two) times daily.   Barberry-Oreg Grape-Goldenseal (BERBERINE COMPLEX PO), Take by mouth.   beta carotene 25000 UNIT capsule, Take 20,000 Units by mouth once a week.   Cholecalciferol (VITAMIN D PO), Take  10,000 Int'l Units by mouth daily.   hydroxychloroquine (PLAQUENIL) 200 MG tablet, Take 1 tablet (200 mg total) by mouth daily.   lactobacillus acidophilus (BACID) TABS tablet, Take 2 tablets by mouth daily.   MAGNESIUM PO, Take 400 mg by mouth. 5 days per week   MILK THISTLE PO, Take 600 mg by mouth daily.   minocycline (MINOCIN,DYNACIN) 50 MG capsule, Take by mouth. Takes 128m Mon, Wed, Fri   Omega-3 Fatty Acids (OMEGA-3 FISH OIL PO), Take by mouth.   Reviewed prior external information including notes and imaging from  primary care provider As well as notes that were available from care everywhere and other healthcare systems.  Past medical history, social, surgical and family history all reviewed in electronic medical record.  No pertanent information unless stated regarding to the chief complaint.   Review of Systems:  No headache, visual changes, nausea, vomiting, diarrhea, constipation, dizziness, abdominal pain, skin rash, fevers, chills, night sweats, weight loss, swollen lymph nodes, body aches, joint swelling, chest pain, shortness of breath, mood changes. POSITIVE muscle aches  Objective  Blood pressure (!) 140/98, pulse 67, height 5' 4.5" (1.638 m), weight 175 lb (79.4 kg), last menstrual period 12/02/2012, SpO2 99 %.   General: No apparent distress alert and oriented x3 mood and affect normal, dressed appropriately.  HEENT: Pupils equal, extraocular movements intact  Respiratory: Patient's speak in full sentences and does not appear short of breath  Cardiovascular: No lower extremity edema, non tender, no erythema  Gait normal with good balance and coordination.  MSK: Patient does have less synovitis of the hand noted.  Patient's knee does have mild crepitus on the left side.  No significant instability.  Patient's wrist does have less swelling but still has the fullness noted on the palmar aspect of the wrist.  Seems to be more midline to the ulnar side and not diffusely  across anymore.  Limited muscular skeletal ultrasound was performed and interpreted by SHulan Saas M  Limited ultrasound of the right wrist shows the patient does have hypoechoic changes still noted of the superficial activity likely more of a tendinitis.  Patient also has some neovascularization and increasing Doppler flow significantly in the tendons himself.  Left knee shows the patient does have a significant synovitis noted but no true effusion noted of the knee.    Impression and Recommendations:     The above documentation has been reviewed and is accurate and complete ZLyndal Pulley DO

## 2021-02-18 ENCOUNTER — Ambulatory Visit: Payer: Self-pay

## 2021-02-18 ENCOUNTER — Other Ambulatory Visit: Payer: Self-pay

## 2021-02-18 ENCOUNTER — Encounter: Payer: Self-pay | Admitting: Family Medicine

## 2021-02-18 ENCOUNTER — Ambulatory Visit: Payer: Commercial Managed Care - PPO | Admitting: Family Medicine

## 2021-02-18 VITALS — BP 140/98 | HR 67 | Ht 64.5 in | Wt 175.0 lb

## 2021-02-18 DIAGNOSIS — M25531 Pain in right wrist: Secondary | ICD-10-CM | POA: Diagnosis not present

## 2021-02-18 DIAGNOSIS — M25532 Pain in left wrist: Secondary | ICD-10-CM

## 2021-02-18 DIAGNOSIS — M25562 Pain in left knee: Secondary | ICD-10-CM | POA: Diagnosis not present

## 2021-02-18 DIAGNOSIS — M659 Synovitis and tenosynovitis, unspecified: Secondary | ICD-10-CM

## 2021-02-18 NOTE — Assessment & Plan Note (Signed)
Resolved at this time he continues to have synovitis of the left knee.

## 2021-02-18 NOTE — Assessment & Plan Note (Signed)
Patient does have what appears to be a reactive tendinitis noted.  We discussed the possibility of possible advanced imaging with patient having some abnormal neovascularization but this could be secondary to more of the flare of the underlying inflammatory polyarthropathy.  Discussed with her as well as the possibility of injection.  Patient feels that she is making slow progress with her being on the Plaquenil regularly and we will continue to monitor.  Discussed with patient that greater than 31 minutes.  Follow-up with me again 2 to 3 months

## 2021-02-18 NOTE — Patient Instructions (Addendum)
Good to see you  I think you are making progress and be proud of that  No real changes  If the wrist get worse lets consider injection or MRI for sure.  Otherwise see me again in 2 months and we will make sure doing well

## 2021-02-18 NOTE — Assessment & Plan Note (Signed)
Cellulitis of the left knee noted on ultrasound today.  Discussed icing regimen and home exercises.  Discussed which activities to do and which ones to avoid.  Increase activity slowly.

## 2021-03-10 ENCOUNTER — Other Ambulatory Visit: Payer: Self-pay

## 2021-03-10 ENCOUNTER — Ambulatory Visit (INDEPENDENT_AMBULATORY_CARE_PROVIDER_SITE_OTHER): Payer: Commercial Managed Care - PPO | Admitting: Obstetrics and Gynecology

## 2021-03-10 ENCOUNTER — Encounter: Payer: Self-pay | Admitting: Obstetrics and Gynecology

## 2021-03-10 VITALS — BP 130/82 | HR 88 | Ht 63.78 in | Wt 174.0 lb

## 2021-03-10 DIAGNOSIS — Z01419 Encounter for gynecological examination (general) (routine) without abnormal findings: Secondary | ICD-10-CM

## 2021-03-10 DIAGNOSIS — N814 Uterovaginal prolapse, unspecified: Secondary | ICD-10-CM | POA: Diagnosis not present

## 2021-03-10 NOTE — Progress Notes (Signed)
60 y.o. G0P0 Married White or Caucasian Not Hispanic or Latino female here for annual exam.  No vaginal bleeding. Not currently sexually active.    She sees an integrative medicine specialist. Not some traditional and non traditional medication for her RA. She is coming out of a 6 month RA flare. She started plaquenil again.   Patient's last menstrual period was 12/02/2012 (approximate).          Sexually active: No.  The current method of family planning is post menopausal status.    Exercising: Yes.     Yoga and walking  Smoker:  no  Health Maintenance: Pap:  02-16-18 neg HPV HR neg             12/26/14 Neg:Neg HR HPV History of abnormal Pap:  no MMG:  03/25/20  B-rads 1 neg  BMD:   none  Colonoscopy: 05/2020 normal f/u 5 years  TDaP:  11/25/10  Gardasil: na    reports that she has never smoked. She has never used smokeless tobacco. She reports that she does not drink alcohol and does not use drugs. Homemaker.  Past Medical History:  Diagnosis Date   Foot pain, bilateral    Hypercholesteremia    Hypertension    Prediabetes    Rheumatoid arthritis ([place])     Past Surgical History:  Procedure Laterality Date   COLONOSCOPY  03/2012   (2) polyps    Current Outpatient Medications  Medication Sig Dispense Refill   Alpha-Lipoic Acid 600 MG CAPS Take 600 mg by mouth 2 (two) times daily.     Ascorbic Acid (VITAMIN C PO) Take 2,000 mg by mouth 2 (two) times daily.     Barberry-Oreg Grape-Goldenseal (BERBERINE COMPLEX PO) Take by mouth.     beta carotene 25000 UNIT capsule Take 20,000 Units by mouth once a week. Vitamin A     Black Pepper-Turmeric (TURMERIC CURCUMIN) 09-998 MG CAPS Take 1,000 mg by mouth 2 (two) times daily.     Cholecalciferol (VITAMIN D PO) Take 10,000 Int'l Units by mouth daily.     diclofenac (VOLTAREN) 75 MG EC tablet Take 1 tablet (75 mg total) by mouth daily as needed for moderate pain. 90 tablet 1   hydroxychloroquine (PLAQUENIL) 200 MG tablet Take 1 tablet  (200 mg total) by mouth daily. 90 tablet 1   lactobacillus acidophilus (BACID) TABS tablet Take 2 tablets by mouth daily.     MAGNESIUM PO Take 400 mg by mouth. 5 days per week     MILK THISTLE PO Take 600 mg by mouth daily.     minocycline (MINOCIN,DYNACIN) 50 MG capsule Take by mouth. Takes 146m Mon, Wed, Fri     Omega-3 Fatty Acids (OMEGA-3 FISH OIL PO) Take by mouth.     No current facility-administered medications for this visit.    Family History  Problem Relation Age of Onset   Diabetes Mother    Hypertension Mother    Depression Mother    Diabetes Father    Multiple myeloma Father    Thyroid disease Sister    Hypertension Brother    Rheum arthritis Maternal Grandmother    Rheum arthritis Maternal Uncle    Rheum arthritis Cousin     Review of Systems  All other systems reviewed and are negative.  Exam:   BP 130/82   Pulse 88   Ht 5' 3.78" (1.62 m)   Wt 174 lb (78.9 kg)   LMP 12/02/2012 (Approximate)   SpO2 100%  BMI 30.07 kg/m   Weight change: _0 @ Height:   Height: 5' 3.78" (162 cm)  Ht Readings from Last 3 Encounters:  03/10/21 5' 3.78" (1.62 m)  02/18/21 5' 4.5" (1.638 m)  02/10/21 5' 4.5" (1.638 m)    General appearance: alert, cooperative and appears stated age Head: Normocephalic, without obvious abnormality, atraumatic Neck: no adenopathy, supple, symmetrical, trachea midline and thyroid normal to inspection and palpation Lungs: clear to auscultation bilaterally Cardiovascular: regular rate and rhythm Breasts: normal appearance, no masses or tenderness Abdomen: soft, non-tender; non distended,  no masses,  no organomegaly Extremities: extremities normal, atraumatic, no cyanosis or edema Skin: Skin color, texture, turgor normal. No rashes or lesions Lymph nodes: Cervical, supraclavicular, and axillary nodes normal. No abnormal inguinal nodes palpated Neurologic: Grossly normal   Pelvic: External genitalia:  no lesions               Urethra:  normal appearing urethra with no masses, tenderness or lesions              Bartholins and Skenes: normal                 Vagina: mildly atrophic appearing vagina with normal color and discharge, no lesions              Cervix: no lesions and elongated. Grade 2 uterine prolapse with valsalva No other significant prolapse               Bimanual Exam:  Uterus:  normal size, contour, position, consistency, mobility, non-tender              Adnexa: no mass, fullness, tenderness               Rectovaginal: Confirms               Anus:  normal sphincter tone, no lesions  Gae Dry chaperoned for the exam.  1. Well woman exam Discussed breast self exam Discussed calcium and vit D intake Labs UTD Mammogram scheduled Colonoscopy UTD  2. Uterine prolapse Not symptomatic. Given ACOG handout, discussed prolapse. No treatment needed at this time Avoid heavy lifting and straining Kegel information given.

## 2021-03-10 NOTE — Patient Instructions (Addendum)
EXERCISE   We recommended that you start or continue a regular exercise program for good health. Physical activity is anything that gets your body moving, some is better than none. The CDC recommends 150 minutes per week of Moderate-Intensity Aerobic Activity and 2 or more days of Muscle Strengthening Activity.  Benefits of exercise are limitless: helps weight loss/weight maintenance, improves mood and energy, helps with depression and anxiety, improves sleep, tones and strengthens muscles, improves balance, improves bone density, protects from chronic conditions such as heart disease, high blood pressure and diabetes and so much more. To learn more visit: https://www.cdc.gov/physicalactivity/index.html  DIET: Good nutrition starts with a healthy diet of fruits, vegetables, whole grains, and lean protein sources. Drink plenty of water for hydration. Minimize empty calories, sodium, sweets. For more information about dietary recommendations visit: https://health.gov/our-work/nutrition-physical-activity/dietary-guidelines and https://www.myplate.gov/  ALCOHOL:  Women should limit their alcohol intake to no more than 7 drinks/beers/glasses of wine (combined, not each!) per week. Moderation of alcohol intake to this level decreases your risk of breast cancer and liver damage.  If you are concerned that you may have a problem, or your friends have told you they are concerned about your drinking, there are many resources to help. A well-known program that is free, effective, and available to all people all over the nation is Alcoholics Anonymous.  Check out this site to learn more: https://www.aa.org/   CALCIUM AND VITAMIN D:  Adequate intake of calcium and Vitamin D are recommended for bone health.  You should be getting between 1000-1200 mg of calcium and 800 units of Vitamin D daily between diet and supplements  PAP SMEARS:  Pap smears, to check for cervical cancer or precancers,  have traditionally been  done yearly, scientific advances have shown that most women can have pap smears less often.  However, every woman still should have a physical exam from her gynecologist every year. It will include a breast check, inspection of the vulva and vagina to check for abnormal growths or skin changes, a visual exam of the cervix, and then an exam to evaluate the size and shape of the uterus and ovaries. We will also provide age appropriate advice regarding health maintenance, like when you should have certain vaccines, screening for sexually transmitted diseases, bone density testing, colonoscopy, mammograms, etc.   MAMMOGRAMS:  All women over 40 years old should have a routine mammogram.   COLON CANCER SCREENING: Now recommend starting at age 45. At this time colonoscopy is not covered for routine screening until 50. There are take home tests that can be done between 45-49.   COLONOSCOPY:  Colonoscopy to screen for colon cancer is recommended for all women at age 50.  We know, you hate the idea of the prep.  We agree, BUT, having colon cancer and not knowing it is worse!!  Colon cancer so often starts as a polyp that can be seen and removed at colonscopy, which can quite literally save your life!  And if your first colonoscopy is normal and you have no family history of colon cancer, most women don't have to have it again for 10 years.  Once every ten years, you can do something that may end up saving your life, right?  We will be happy to help you get it scheduled when you are ready.  Be sure to check your insurance coverage so you understand how much it will cost.  It may be covered as a preventative service at no cost, but you should check   your particular policy.      Breast Self-Awareness Breast self-awareness means being familiar with how your breasts look and feel. It involves checking your breasts regularly and reporting any changes to your health care provider. Practicing breast self-awareness is  important. A change in your breasts can be a sign of a serious medical problem. Being familiar with how your breasts look and feel allows you to find any problems early, when treatment is more likely to be successful. All women should practice breast self-awareness, including women who have had breast implants. How to do a breast self-exam One way to learn what is normal for your breasts and whether your breasts are changing is to do a breast self-exam. To do a breast self-exam: Look for Changes  Remove all the clothing above your waist. Stand in front of a mirror in a room with good lighting. Put your hands on your hips. Push your hands firmly downward. Compare your breasts in the mirror. Look for differences between them (asymmetry), such as: Differences in shape. Differences in size. Puckers, dips, and bumps in one breast and not the other. Look at each breast for changes in your skin, such as: Redness. Scaly areas. Look for changes in your nipples, such as: Discharge. Bleeding. Dimpling. Redness. A change in position. Feel for Changes Carefully feel your breasts for lumps and changes. It is best to do this while lying on your back on the floor and again while sitting or standing in the shower or tub with soapy water on your skin. Feel each breast in the following way: Place the arm on the side of the breast you are examining above your head. Feel your breast with the other hand. Start in the nipple area and make  inch (2 cm) overlapping circles to feel your breast. Use the pads of your three middle fingers to do this. Apply light pressure, then medium pressure, then firm pressure. The light pressure will allow you to feel the tissue closest to the skin. The medium pressure will allow you to feel the tissue that is a little deeper. The firm pressure will allow you to feel the tissue close to the ribs. Continue the overlapping circles, moving downward over the breast until you feel your  ribs below your breast. Move one finger-width toward the center of the body. Continue to use the  inch (2 cm) overlapping circles to feel your breast as you move slowly up toward your collarbone. Continue the up and down exam using all three pressures until you reach your armpit.  Write Down What You Find  Write down what is normal for each breast and any changes that you find. Keep a written record with breast changes or normal findings for each breast. By writing this information down, you do not need to depend only on memory for size, tenderness, or location. Write down where you are in your menstrual cycle, if you are still menstruating. If you are having trouble noticing differences in your breasts, do not get discouraged. With time you will become more familiar with the variations in your breasts and more comfortable with the exam. How often should I examine my breasts? Examine your breasts every month. If you are breastfeeding, the best time to examine your breasts is after a feeding or after using a breast pump. If you menstruate, the best time to examine your breasts is 5-7 days after your period is over. During your period, your breasts are lumpier, and it may be more   difficult to notice changes. When should I see my health care provider? See your health care provider if you notice: A change in shape or size of your breasts or nipples. A change in the skin of your breast or nipples, such as a reddened or scaly area. Unusual discharge from your nipples. A lump or thick area that was not there before. Pain in your breasts. Anything that concerns you. Kegel Exercises Kegel exercises can help strengthen your pelvic floor muscles. The pelvic floor is a group of muscles that support your rectum, small intestine, and bladder. In females, pelvic floor muscles also help support the womb (uterus). These muscles help you control the flow of urine and stool. Kegel exercises are painless and  simple, and they do not require any equipment. Your provider may suggest Kegel exercises to: Improve bladder and bowel control. Improve sexual response. Improve weak pelvic floor muscles after surgery to remove the uterus (hysterectomy) or pregnancy (females). Improve weak pelvic floor muscles after prostate gland removal or surgery (males). Kegel exercises involve squeezing your pelvic floor muscles, which are the same muscles you squeeze when you try to stop the flow of urine or keep from passing gas. The exercises can be done while sitting, standing, or lying down, but it is best to vary your position. Exercises How to do Kegel exercises: Squeeze your pelvic floor muscles tight. You should feel a tight lift in your rectal area. If you are a female, you should also feel a tightness in your vaginal area. Keep your stomach, buttocks, and legs relaxed. Hold the muscles tight for up to 10 seconds. Breathe normally. Relax your muscles. Repeat as told by your health care provider. Repeat this exercise daily as told by your health care provider. Continue to do this exercise for at least 4-6 weeks, or for as long as told by your health care provider. You may be referred to a physical therapist who can help you learn more about how to do Kegel exercises. Depending on your condition, your health care provider may recommend: Varying how long you squeeze your muscles. Doing several sets of exercises every day. Doing exercises for several weeks. Making Kegel exercises a part of your regular exercise routine. This information is not intended to replace advice given to you by your health care provider. Make sure you discuss any questions you have with your health care provider. Document Revised: 04/17/2020 Document Reviewed: 12/15/2017 Elsevier Patient Education  [place].

## 2021-03-13 ENCOUNTER — Encounter: Payer: Self-pay | Admitting: Internal Medicine

## 2021-03-17 ENCOUNTER — Encounter: Payer: Self-pay | Admitting: Family Medicine

## 2021-03-17 ENCOUNTER — Other Ambulatory Visit: Payer: Self-pay

## 2021-03-28 ENCOUNTER — Encounter: Payer: Self-pay | Admitting: Obstetrics and Gynecology

## 2021-04-17 NOTE — Progress Notes (Signed)
Julie [name] [address] [place] Phone: [phone] Subjective:   Julie [name], am serving as a scribe for Dr. Hulan Saas. This visit occurred during the SARS-CoV-2 public health emergency.  Safety protocols were in place, including screening questions prior to the visit, additional usage of staff PPE, and extensive cleaning of exam room while observing appropriate contact time as indicated for disinfecting solutions.   I'm seeing this patient by the request  of:  Patient, No Pcp Per (Inactive)  CC: Left knee and right wrist pain  IRS:[id number]  02/18/2021 Patient does have what appears to be a reactive tendinitis noted.  We discussed the possibility of possible advanced imaging with patient having some abnormal neovascularization but this could be secondary to more of the flare of the underlying inflammatory polyarthropathy.  Discussed with her as well as the possibility of injection.  Patient feels that she is making slow progress with her being on the Plaquenil regularly and we will continue to monitor.  Discussed with patient that greater than 31 minutes.  Follow-up with me again 2 to 3 months  Cellulitis of the left knee noted on ultrasound today.  Discussed icing regimen and home exercises.  Discussed which activities to do and which ones to avoid.  Increase activity slowly.  Update 04/22/2021 Julie [name] is a 60 y.o. female coming in with complaint of L knee and R wrist pain. Patient states knee is doing well. No pain for the most part. Pain in wrist also pain free. ROM has increased, able to to do ADL mostly pain free. No new complaints. Patient at last exam did have what appeared to be more of a reactive tendinitis from inflammatory arthropathy.  Patient was making progress and wanted to continue to monitor. On plaquenil      Past Medical History:  Diagnosis Date   Foot pain, bilateral    Hypercholesteremia     Hypertension    Prediabetes    Rheumatoid arthritis ([place])    Past Surgical History:  Procedure Laterality Date   COLONOSCOPY  03/2012   (2) polyps   Social History   Socioeconomic History   Marital status: Married    Spouse name: Not on file   Number of children: Not on file   Years of education: Not on file   Highest education level: Not on file  Occupational History   Not on file  Tobacco Use   Smoking status: Never   Smokeless tobacco: Never  Vaping Use   Vaping Use: Never used  Substance and Sexual Activity   Alcohol use: No    Alcohol/week: 0.0 standard drinks   Drug use: No   Sexual activity: Yes    Partners: Male    Birth control/protection: Post-menopausal  Other Topics Concern   Not on file  Social History Narrative   Not on file   Social Determinants of Health   Financial Resource Strain: Not on file  Food Insecurity: Not on file  Transportation Needs: Not on file  Physical Activity: Not on file  Stress: Not on file  Social Connections: Not on file   Allergies  Allergen Reactions   Zithromax [Azithromycin] Hives   Family History  Problem Relation Age of Onset   Diabetes Mother    Hypertension Mother    Depression Mother    Diabetes Father    Multiple myeloma Father    Thyroid disease Sister    Hypertension Brother    Rheum arthritis  Maternal Grandmother    Rheum arthritis Maternal Uncle    Rheum arthritis Cousin        Current Outpatient Medications (Analgesics):    diclofenac (VOLTAREN) 75 MG EC tablet, Take 1 tablet (75 mg total) by mouth daily as needed for moderate pain.   Current Outpatient Medications (Other):    Alpha-Lipoic Acid 600 MG CAPS, Take 600 mg by mouth 2 (two) times daily.   Ascorbic Acid (VITAMIN C PO), Take 2,000 mg by mouth 2 (two) times daily.   Barberry-Oreg Grape-Goldenseal (BERBERINE COMPLEX PO), Take by mouth.   beta carotene 25000 UNIT capsule, Take 20,000 Units by mouth once a week. Vitamin A   Black  Pepper-Turmeric (TURMERIC CURCUMIN) 09-998 MG CAPS, Take 1,000 mg by mouth 2 (two) times daily.   Cholecalciferol (VITAMIN D PO), Take 10,000 Int'l Units by mouth daily.   hydroxychloroquine (PLAQUENIL) 200 MG tablet, Take 1 tablet (200 mg total) by mouth daily.   lactobacillus acidophilus (BACID) TABS tablet, Take 2 tablets by mouth daily.   MAGNESIUM PO, Take 400 mg by mouth. 5 days per week   MILK THISTLE PO, Take 600 mg by mouth daily.   minocycline (MINOCIN,DYNACIN) 50 MG capsule, Take by mouth. Takes 127m Mon, Wed, Fri   Omega-3 Fatty Acids (OMEGA-3 FISH OIL PO), Take by mouth.     Objective  Blood pressure (!) 156/98, pulse 81, height _0  (1.6 m), weight 174 lb (78.9 kg), last menstrual period 12/02/2012, SpO2 96 %.   General: No apparent distress alert and oriented x3 mood and affect normal, dressed appropriately.  HEENT: Pupils equal, extraocular movements intact  Respiratory: Patient's speak in full sentences and does not appear short of breath  Cardiovascular: No lower extremity edema, non tender, no erythema  Gait normal with good balance and coordination.  MSK: Wrist exam shows some very mild swelling noted over the flexor tendons.  Patient was nontender on exam.  Neurovascular intact.  Good grip strength.  Limited muscular skeletal ultrasound was performed and interpreted by SHulan Saas M  Limited ultrasound still shows that there is some hypoechoic changes within the third flexor compartment of the wrist. Impression: Continued mild hypoechoic changes of the tendons but improvement noted.    Impression and Recommendations:     The above documentation has been reviewed and is accurate and complete Julie Pulley DO

## 2021-04-22 ENCOUNTER — Other Ambulatory Visit: Payer: Self-pay

## 2021-04-22 ENCOUNTER — Ambulatory Visit: Payer: Commercial Managed Care - PPO | Admitting: Family Medicine

## 2021-04-22 ENCOUNTER — Ambulatory Visit: Payer: Self-pay

## 2021-04-22 VITALS — BP 156/98 | HR 81 | Ht 63.0 in | Wt 174.0 lb

## 2021-04-22 DIAGNOSIS — M25562 Pain in left knee: Secondary | ICD-10-CM | POA: Diagnosis not present

## 2021-04-22 DIAGNOSIS — M064 Inflammatory polyarthropathy: Secondary | ICD-10-CM

## 2021-04-22 NOTE — Patient Instructions (Signed)
Made my day and holiday with how good you're doing Any changes you know where I am See you again in 4-6 months if needed

## 2021-04-22 NOTE — Assessment & Plan Note (Signed)
Patient has responded extremely well at this moment.  Discussed icing regimen and home exercises.  I do think that Plaquenil has made a significant difference.  We discussed with continued inflammation noted in the wrist that we could consider the possibility of advanced imaging but patient declined.  Wants to continue conservative therapy and follow-up again in 6 months then if still doing well can follow-up as needed

## 2021-04-25 ENCOUNTER — Encounter: Payer: Self-pay | Admitting: Nurse Practitioner

## 2021-04-25 ENCOUNTER — Ambulatory Visit: Payer: Commercial Managed Care - PPO | Admitting: Nurse Practitioner

## 2021-04-25 ENCOUNTER — Other Ambulatory Visit: Payer: Self-pay

## 2021-04-25 DIAGNOSIS — R0789 Other chest pain: Secondary | ICD-10-CM | POA: Diagnosis not present

## 2021-04-25 DIAGNOSIS — I10 Essential (primary) hypertension: Secondary | ICD-10-CM | POA: Diagnosis not present

## 2021-04-25 DIAGNOSIS — Z23 Encounter for immunization: Secondary | ICD-10-CM

## 2021-04-25 MED ORDER — LISINOPRIL 2.5 MG PO TABS: 2.5000 mg | ORAL_TABLET | Freq: Every day | ORAL | 5 refills | Status: DC

## 2021-04-25 NOTE — Progress Notes (Signed)
Subjective:  Patient ID: Julie [name], female    DOB: [birth year]  Age: 60 y.o. MRN: [id number]  CC: Establish Care (New patient/ Pt states she has been monitoring BP at home and is concerned with elevated readings. Pt states this started 2 months ago and she felt it was anxiety related but states she has nothing to be anxious about. /Tdap vaccine given today .)  HPI  Hypertension Chronic, waxing and waning Initially diagnosed in 2009, lisinopril 49m was prescribed in past. Discontinued in 2016 due to sustained hypotension. Recently started feeling "like my heart is pounding". Onset 235monthago. Occurs intermittently, midday, last for several hours, no known trigger, does not interferes with ADLs, does not get worse with activity, no other symptoms present. More noticeable when sitting, resolves spontaneously. BP reading at home range from 130s/80s-150s/90s and HR ranges from 60s-70s in last 66m666monthimited exercise due to rheumatoid arthritis Maintains a low carb diet, no particular restriction on sodium content. no caffeine consumption. No tobacco use, no ETOH use. No FHx of CAD. BP Readings from Last 3 Encounters:  04/25/21 140/78  04/22/21 (!) 156/98  03/10/21 130/82  ECG today: NSR with RBBB, no previous ECG to compare. She declined echocardiogram at this time. CBC and CMP completed 04/2021 by rheumatology: normal  Start lisinopril Maintain DASH diet Continue to monitor BP once a day in AM F/up in 66mo77monthpression screen PHQ 2/9 04/25/2021  Decreased Interest 0  Down, Depressed, Hopeless 0  PHQ - 2 Score 0    Reviewed past Medical, Social and Family history today.  Outpatient Medications Prior to Visit  Medication Sig Dispense Refill   Alpha-Lipoic Acid 600 MG CAPS Take 600 mg by mouth 2 (two) times daily.     Ascorbic Acid (VITAMIN C PO) Take 2,000 mg by mouth 2 (two) times daily.     Barberry-Oreg Grape-Goldenseal (BERBERINE COMPLEX PO) Take by mouth.     beta  carotene 25000 UNIT capsule Take 20,000 Units by mouth once a week. Vitamin A     Black Pepper-Turmeric (TURMERIC CURCUMIN) 09-998 MG CAPS Take 1,000 mg by mouth 2 (two) times daily.     Cholecalciferol (VITAMIN D PO) Take 10,000 Int'l Units by mouth daily.     diclofenac (VOLTAREN) 75 MG EC tablet Take 1 tablet (75 mg total) by mouth daily as needed for moderate pain. 90 tablet 1   hydroxychloroquine (PLAQUENIL) 200 MG tablet Take 1 tablet (200 mg total) by mouth daily. 90 tablet 1   lactobacillus acidophilus (BACID) TABS tablet Take 2 tablets by mouth daily.     MAGNESIUM PO Take 400 mg by mouth. 5 days per week     MILK THISTLE PO Take 600 mg by mouth daily.     minocycline (MINOCIN,DYNACIN) 50 MG capsule Take by mouth. Takes 100mg45m, Wed, Fri     Omega-3 Fatty Acids (OMEGA-3 FISH OIL PO) Take by mouth.     No facility-administered medications prior to visit.   Family History  Problem Relation Age of Onset   Diabetes Mother    Hypertension Mother    Depression Mother    Asthma Mother    COPD Mother    Kidney disease Mother    Diabetes Father    Multiple myeloma Father    Cancer Father    Hypertension Father    Thyroid disease Sister    Hypertension Brother    Rheum arthritis Maternal Grandmother    Arthritis Maternal Grandmother  Rheum arthritis Maternal Uncle    Rheum arthritis Cousin     ROS See HPI  Objective:  BP 140/78 (BP Location: Left Arm, Patient Position: Sitting, Cuff Size: Normal)    Pulse 90    Temp 97.9 F (36.6 C) (Temporal)    Ht 5' 4"  (1.626 m)    Wt 174 lb 6.4 oz (79.1 kg)    LMP 12/02/2012 (Approximate)    SpO2 99%    BMI 29.94 kg/m   Physical Exam Vitals reviewed.  Cardiovascular:     Rate and Rhythm: Normal rate and regular rhythm.     Pulses: Normal pulses.     Heart sounds: Normal heart sounds.  Pulmonary:     Effort: Pulmonary effort is normal.     Breath sounds: Normal breath sounds.  Musculoskeletal:     Right lower leg: No edema.      Left lower leg: No edema.  Neurological:     Mental Status: She is alert and oriented to person, place, and time.   Assessment & Plan:  This visit occurred during the SARS-CoV-2 public health emergency.  Safety protocols were in place, including screening questions prior to the visit, additional usage of staff PPE, and extensive cleaning of exam room while observing appropriate contact time as indicated for disinfecting solutions.   Julie [name] was seen today for establish care.  Diagnoses and all orders for this visit:  Primary hypertension -     lisinopril (ZESTRIL) 2.5 MG tablet; Take 1 tablet (2.5 mg total) by mouth daily. -     EKG 12-Lead  Atypical chest pain  Need for diphtheria-tetanus-pertussis (Tdap) vaccine -     Tdap vaccine greater than or equal to 7yo IM   Problem List Items Addressed This Visit       Cardiovascular and Mediastinum   Hypertension    Chronic, waxing and waning Initially diagnosed in 2009, lisinopril 18m was prescribed in past. Discontinued in 2016 due to sustained hypotension. Recently started feeling "like my heart is pounding". Onset 273monthago. Occurs intermittently, midday, last for several hours, no known trigger, does not interferes with ADLs, does not get worse with activity, no other symptoms present. More noticeable when sitting, resolves spontaneously. BP reading at home range from 130s/80s-150s/90s and HR ranges from 60s-70s in last 70m56monthimited exercise due to rheumatoid arthritis Maintains a low carb diet, no particular restriction on sodium content. no caffeine consumption. No tobacco use, no ETOH use. No FHx of CAD. BP Readings from Last 3 Encounters:  04/25/21 140/78  04/22/21 (!) 156/98  03/10/21 130/82  ECG today: NSR with RBBB, no previous ECG to compare. She declined echocardiogram at this time. CBC and CMP completed 04/2021 by rheumatology: normal  Start lisinopril Maintain DASH diet Continue to monitor BP once a day in  AM F/up in 70mo2month  Relevant Medications   lisinopril (ZESTRIL) 2.5 MG tablet   Other Relevant Orders   EKG 12-Lead (Completed)   Other Visit Diagnoses     Atypical chest pain       Need for diphtheria-tetanus-pertussis (Tdap) vaccine       Relevant Orders   Tdap vaccine greater than or equal to 7yo IM (Completed)       Follow-up: Return in about 4 weeks (around 05/23/2021) for HTN (video).  CharWilfred Lacy

## 2021-04-25 NOTE — Assessment & Plan Note (Addendum)
Chronic, waxing and waning Initially diagnosed in 2009, lisinopril 5mg  was prescribed in past. Discontinued in 2016 due to sustained hypotension. Recently started feeling "like my heart is pounding". Onset 3months ago. Occurs intermittently, midday, last for several hours, no known trigger, does not interferes with ADLs, does not get worse with activity, no other symptoms present. More noticeable when sitting, resolves spontaneously. BP reading at home range from 130s/80s-150s/90s and HR ranges from 60s-70s in last 19month. Limited exercise due to rheumatoid arthritis Maintains a low carb diet, no particular restriction on sodium content. no caffeine consumption. No tobacco use, no ETOH use. No FHx of CAD. BP Readings from Last 3 Encounters:  04/25/21 140/78  04/22/21 (!) 156/98  03/10/21 130/82  ECG today: NSR with RBBB, no previous ECG to compare. She declined echocardiogram at this time. CBC and CMP completed 04/2021 by rheumatology: normal  Start lisinopril Maintain DASH diet Continue to monitor BP once a day in AM F/up in 36month

## 2021-04-25 NOTE — Patient Instructions (Addendum)
ECG indicates normal sinus rhythm with right bundle branch block. This could be due to hypertension for cardiomegaly. Let me know if you change you mind about getting an echocardiogram. Start lisinopril Continue to monitor BP in AM Maintain low sodium diet. F/up in 103month (video)  DASH Eating Plan DASH stands for Dietary Approaches to Stop Hypertension. The DASH eating plan is a healthy eating plan that has been shown to: Reduce high blood pressure (hypertension). Reduce your risk for type 2 diabetes, heart disease, and stroke. Help with weight loss. What are tips for following this plan? Reading food labels Check food labels for the amount of salt (sodium) per serving. Choose foods with less than 5 percent of the Daily Value of sodium. Generally, foods with less than 300 milligrams (mg) of sodium per serving fit into this eating plan. To find whole grains, look for the word "whole" as the first word in the ingredient list. Shopping Buy products labeled as "low-sodium" or "no salt added." Buy fresh foods. Avoid canned foods and pre-made or frozen meals. Cooking Avoid adding salt when cooking. Use salt-free seasonings or herbs instead of table salt or sea salt. Check with your health care provider or pharmacist before using salt substitutes. Do not fry foods. Cook foods using healthy methods such as baking, boiling, grilling, roasting, and broiling instead. Cook with heart-healthy oils, such as olive, canola, avocado, soybean, or sunflower oil. Meal planning  Eat a balanced diet that includes: 4 or more servings of fruits and 4 or more servings of vegetables each day. Try to fill one-half of your plate with fruits and vegetables. 6-8 servings of whole grains each day. Less than 6 oz (170 g) of lean meat, poultry, or fish each day. A 3-oz (85-g) serving of meat is about the same size as a deck of cards. One egg equals 1 oz (28 g). 2-3 servings of low-fat dairy each day. One serving is 1 cup  (237 mL). 1 serving of nuts, seeds, or beans 5 times each week. 2-3 servings of heart-healthy fats. Healthy fats called omega-3 fatty acids are found in foods such as walnuts, flaxseeds, fortified milks, and eggs. These fats are also found in cold-water fish, such as sardines, salmon, and mackerel. Limit how much you eat of: Canned or prepackaged foods. Food that is high in trans fat, such as some fried foods. Food that is high in saturated fat, such as fatty meat. Desserts and other sweets, sugary drinks, and other foods with added sugar. Full-fat dairy products. Do not salt foods before eating. Do not eat more than 4 egg yolks a week. Try to eat at least 2 vegetarian meals a week. Eat more home-cooked food and less restaurant, buffet, and fast food. Lifestyle When eating at a restaurant, ask that your food be prepared with less salt or no salt, if possible. If you drink alcohol: Limit how much you use to: 0-1 drink a day for women who are not pregnant. 0-2 drinks a day for men. Be aware of how much alcohol is in your drink. In the [place] one drink equals one 12 oz bottle of beer (355 mL), one 5 oz glass of wine (148 mL), or one 1 oz glass of hard liquor (44 mL). General information Avoid eating more than 2,300 mg of salt a day. If you have hypertension, you may need to reduce your sodium intake to 1,500 mg a day. Work with your health care provider to maintain a healthy body weight or to  lose weight. Ask what an ideal weight is for you. Get at least 30 minutes of exercise that causes your heart to beat faster (aerobic exercise) most days of the week. Activities may include walking, swimming, or biking. Work with your health care provider or dietitian to adjust your eating plan to your individual calorie needs. What foods should I eat? Fruits All fresh, dried, or frozen fruit. Canned fruit in natural juice (without added sugar). Vegetables Fresh or frozen vegetables (raw, steamed,  roasted, or grilled). Low-sodium or reduced-sodium tomato and vegetable juice. Low-sodium or reduced-sodium tomato sauce and tomato paste. Low-sodium or reduced-sodium canned vegetables. Grains Whole-grain or whole-wheat bread. Whole-grain or whole-wheat pasta. Brown rice. [place]. Bulgur. Whole-grain and low-sodium cereals. Pita bread. Low-fat, low-sodium crackers. Whole-wheat flour tortillas. Meats and other proteins Skinless chicken or [place]. Ground chicken or [place]. Pork with fat trimmed off. Fish and seafood. Egg whites. Dried beans, peas, or lentils. Unsalted nuts, nut butters, and seeds. Unsalted canned beans. Lean cuts of beef with fat trimmed off. Low-sodium, lean precooked or cured meat, such as sausages or meat loaves. Dairy Low-fat (1%) or fat-free (skim) milk. Reduced-fat, low-fat, or fat-free cheeses. Nonfat, low-sodium ricotta or cottage cheese. Low-fat or nonfat yogurt. Low-fat, low-sodium cheese. Fats and oils Soft margarine without trans fats. Vegetable oil. Reduced-fat, low-fat, or light mayonnaise and salad dressings (reduced-sodium). Canola, safflower, olive, avocado, soybean, and sunflower oils. Avocado. Seasonings and condiments Herbs. Spices. Seasoning mixes without salt. Other foods Unsalted popcorn and pretzels. Fat-free sweets. The items listed above may not be a complete list of foods and beverages you can eat. Contact a dietitian for more information. What foods should I avoid? Fruits Canned fruit in a light or heavy syrup. Fried fruit. Fruit in cream or butter sauce. Vegetables Creamed or fried vegetables. Vegetables in a cheese sauce. Regular canned vegetables (not low-sodium or reduced-sodium). Regular canned tomato sauce and paste (not low-sodium or reduced-sodium). Regular tomato and vegetable juice (not low-sodium or reduced-sodium). Angie Fava. Olives. Grains Baked goods made with fat, such as croissants, muffins, or some breads. Dry pasta or rice meal  packs. Meats and other proteins Fatty cuts of meat. Ribs. Fried meat. Berniece Salines. Bologna, salami, and other precooked or cured meats, such as sausages or meat loaves. Fat from the back of a pig (fatback). Bratwurst. Salted nuts and seeds. Canned beans with added salt. Canned or smoked fish. Whole eggs or egg yolks. Chicken or [place] with skin. Dairy Whole or 2% milk, cream, and half-and-half. Whole or full-fat cream cheese. Whole-fat or sweetened yogurt. Full-fat cheese. Nondairy creamers. Whipped toppings. Processed cheese and cheese spreads. Fats and oils Butter. Stick margarine. Lard. Shortening. Ghee. Bacon fat. Tropical oils, such as coconut, palm kernel, or palm oil. Seasonings and condiments Onion salt, garlic salt, seasoned salt, table salt, and sea salt. Worcestershire sauce. Tartar sauce. Barbecue sauce. Teriyaki sauce. Soy sauce, including reduced-sodium. Steak sauce. Canned and packaged gravies. Fish sauce. Oyster sauce. Cocktail sauce. Store-bought horseradish. Ketchup. Mustard. Meat flavorings and tenderizers. Bouillon cubes. Hot sauces. Pre-made or packaged marinades. Pre-made or packaged taco seasonings. Relishes. Regular salad dressings. Other foods Salted popcorn and pretzels. The items listed above may not be a complete list of foods and beverages you should avoid. Contact a dietitian for more information. Where to find more information National Heart, Lung, and Blood Institute: https://wilson-eaton.com/ American Heart Association: www.heart.org Academy of Nutrition and Dietetics: www.eatright.[place]: www.kidney.org Summary The DASH eating plan is a healthy eating plan that has been  shown to reduce high blood pressure (hypertension). It may also reduce your risk for type 2 diabetes, heart disease, and stroke. When on the DASH eating plan, aim to eat more fresh fruits and vegetables, whole grains, lean proteins, low-fat dairy, and heart-healthy fats. With the DASH  eating plan, you should limit salt (sodium) intake to 2,300 mg a day. If you have hypertension, you may need to reduce your sodium intake to 1,500 mg a day. Work with your health care provider or dietitian to adjust your eating plan to your individual calorie needs. This information is not intended to replace advice given to you by your health care provider. Make sure you discuss any questions you have with your health care provider. Document Revised: 03/31/2019 Document Reviewed: 03/31/2019 Elsevier Patient Education  2022 [employer].

## 2021-05-01 ENCOUNTER — Ambulatory Visit: Payer: Commercial Managed Care - PPO | Admitting: Nurse Practitioner

## 2021-05-23 ENCOUNTER — Encounter: Payer: Self-pay | Admitting: Nurse Practitioner

## 2021-05-23 ENCOUNTER — Ambulatory Visit: Payer: Commercial Managed Care - PPO | Admitting: Nurse Practitioner

## 2021-05-23 ENCOUNTER — Other Ambulatory Visit: Payer: Self-pay

## 2021-05-23 DIAGNOSIS — I10 Essential (primary) hypertension: Secondary | ICD-10-CM | POA: Diagnosis not present

## 2021-05-23 NOTE — Assessment & Plan Note (Addendum)
Improved BP with lisinopril. Home BP readings fluctuate: 110/70-150/80. Had BP machine since 2008 BP Readings from Last 3 Encounters:  05/23/21 136/90  04/25/21 140/78  04/22/21 (!) 156/98   get new BP machine with upper arm normal size adult upper arm cuff. Continue BP check 3x/week in AM Increase lisinopril dose to 5mg  is BP >140/80. Do not increase dose if SBP <110 and DBP >80. Maintain dose at 2.5mg . Maintain DASH diet Send mychart message with BP reading from new machine in 1week F/up in 26months

## 2021-05-23 NOTE — Patient Instructions (Addendum)
Get new BP machine with upper arm normal size adult upper arm cuff. Continue BP check 3x/week in AM Increase lisinopril dose to 5mg  is BP >140/80. Do not increase dose if SBP <110 and DBP >80. Maintain dose at 2.5mg . Maintain DASH diet Send mychart message with BP reading from new machine in 1week

## 2021-05-23 NOTE — Progress Notes (Signed)
Subjective:  Patient ID: Julie [name], female    DOB: Nov 08, 1960  Age: 61 y.o. MRN: [id number]  CC: Follow-up (4 week f/u on HTN. )  HPI  Hypertension Improved BP with lisinopril. Home BP readings fluctuate: 110/70-150/80. Had BP machine since 2008 BP Readings from Last 3 Encounters:  05/23/21 136/90  04/25/21 140/78  04/22/21 (!) 156/98   get new BP machine with upper arm normal size adult upper arm cuff. Continue BP check 3x/week in AM Increase lisinopril dose to 5mg  is BP >140/80. Do not increase dose if SBP <110 and DBP >80. Maintain dose at 2.5mg . Maintain DASH diet Send mychart message with BP reading from new machine in 1week F/up in 55months  Reviewed past Medical, Social and Family history today.  Outpatient Medications Prior to Visit  Medication Sig Dispense Refill   Alpha-Lipoic Acid 600 MG CAPS Take 600 mg by mouth 2 (two) times daily.     Ascorbic Acid (VITAMIN C PO) Take 2,000 mg by mouth 2 (two) times daily.     Barberry-Oreg Grape-Goldenseal (BERBERINE COMPLEX PO) Take by mouth.     beta carotene 25000 UNIT capsule Take 20,000 Units by mouth once a week. Vitamin A     Black Pepper-Turmeric (TURMERIC CURCUMIN) 09-998 MG CAPS Take 1,000 mg by mouth 2 (two) times daily.     Cholecalciferol (VITAMIN D PO) Take 10,000 Int'l Units by mouth daily.     diclofenac (VOLTAREN) 75 MG EC tablet Take 1 tablet (75 mg total) by mouth daily as needed for moderate pain. 90 tablet 1   hydroxychloroquine (PLAQUENIL) 200 MG tablet Take 1 tablet (200 mg total) by mouth daily. 90 tablet 1   lactobacillus acidophilus (BACID) TABS tablet Take 2 tablets by mouth daily.     MAGNESIUM PO Take 400 mg by mouth. 5 days per week     MILK THISTLE PO Take 600 mg by mouth daily.     minocycline (MINOCIN,DYNACIN) 50 MG capsule Take by mouth. Takes 100mg  Mon, Wed, Fri     Omega-3 Fatty Acids (OMEGA-3 FISH OIL PO) Take by mouth.     lisinopril (ZESTRIL) 2.5 MG tablet Take 1 tablet (2.5 mg  total) by mouth daily. 30 tablet 5   lisinopril (ZESTRIL) 2.5 MG tablet Take 1-2 tablets (2.5-5 mg total) by mouth daily. Take 5mg  if BP >140/80. 90 tablet 3   No facility-administered medications prior to visit.   ROS See HPI  Objective:  BP 136/90 (BP Location: Right Arm, Cuff Size: Large)    Pulse 74    Temp (!) 97.3 F (36.3 C) (Temporal)    Ht 5\' 4"  (1.626 m)    Wt 177 lb 9.6 oz (80.6 kg)    LMP 12/02/2012 (Approximate)    SpO2 99%    BMI 30.48 kg/m   Physical Exam Cardiovascular:     Rate and Rhythm: Normal rate and regular rhythm.     Pulses: Normal pulses.  Pulmonary:     Effort: Pulmonary effort is normal.  Musculoskeletal:     Right lower leg: No edema.     Left lower leg: No edema.  Neurological:     Mental Status: She is alert and oriented to person, place, and time.   Assessment & Plan:  This visit occurred during the SARS-CoV-2 public health emergency.  Safety protocols were in place, including screening questions prior to the visit, additional usage of staff PPE, and extensive cleaning of exam room while observing appropriate contact time as indicated  for disinfecting solutions.   Julie [name] was seen today for follow-up.  Diagnoses and all orders for this visit:  Primary hypertension   Problem List Items Addressed This Visit       Cardiovascular and Mediastinum   Hypertension    Improved BP with lisinopril. Home BP readings fluctuate: 110/70-150/80. Had BP machine since 2008 BP Readings from Last 3 Encounters:  05/23/21 136/90  04/25/21 140/78  04/22/21 (!) 156/98   get new BP machine with upper arm normal size adult upper arm cuff. Continue BP check 3x/week in AM Increase lisinopril dose to 5mg  is BP >140/80. Do not increase dose if SBP <110 and DBP >80. Maintain dose at 2.5mg . Maintain DASH diet Send mychart message with BP reading from new machine in 1week F/up in 89months      Relevant Medications   lisinopril (ZESTRIL) 2.5 MG tablet     Follow-up: Return in about 3 months (around 08/21/2021) for CPE (fasting).  Wilfred Lacy, NP

## 2021-05-27 ENCOUNTER — Encounter: Payer: Self-pay | Admitting: Nurse Practitioner

## 2021-05-29 ENCOUNTER — Ambulatory Visit (INDEPENDENT_AMBULATORY_CARE_PROVIDER_SITE_OTHER): Payer: Commercial Managed Care - PPO

## 2021-05-29 ENCOUNTER — Other Ambulatory Visit: Payer: Self-pay

## 2021-05-29 ENCOUNTER — Other Ambulatory Visit: Payer: Self-pay | Admitting: Nurse Practitioner

## 2021-05-29 ENCOUNTER — Ambulatory Visit: Payer: Commercial Managed Care - PPO

## 2021-05-29 DIAGNOSIS — I10 Essential (primary) hypertension: Secondary | ICD-10-CM

## 2021-05-29 MED ORDER — LISINOPRIL 5 MG PO TABS
5.0000 mg | ORAL_TABLET | Freq: Every day | ORAL | 1 refills | Status: DC
Start: 2021-05-29 — End: 2021-11-17

## 2021-05-29 NOTE — Progress Notes (Signed)
Per the orders of Wilfred Lacy, NP pt is here for Blood pressure check home vs manual cuff in office.    Home Monitor (L.A. In office) : 162/98  Manual Cuff (L.A. In office):  164/96   Manual Cuff (R.A. in office) 158/98  Reported to provider: Wilfred Lacy, NP.   Per Baldo Ash NP pt is to increase Lisinopril to  5mg  daily.

## 2021-06-11 ENCOUNTER — Encounter: Payer: Self-pay | Admitting: Physical Therapy

## 2021-06-11 NOTE — Therapy (Signed)
[facility] [place], [place], [address] Phone: [phone]   Fax:  [phone]  Patient Details  Name: Julie [name] MRN: [id number] Date of Birth: 11-Aug-1960 Referring Provider:  No ref. provider found  Encounter Date: 06/11/2021  PHYSICAL THERAPY DISCHARGE SUMMARY  Visits from Start of Care: 5  Current functional level related to goals / functional outcomes: Has not returned since last visit August 2022; FOTO DC complete 06/11/21   Remaining deficits: Unable to assess    Education / Equipment: Unable to assess    Patient agrees to discharge. Patient goals were partially met. Patient is being discharged due to not returning since the last visit.  Ann Lions PT, DPT, PN2   Supplemental Physical Therapist [facility]. [place], [place], [address] Phone: [phone]   Fax:  [phone]

## 2021-08-11 ENCOUNTER — Ambulatory Visit: Payer: Commercial Managed Care - PPO | Admitting: Internal Medicine

## 2021-08-17 ENCOUNTER — Other Ambulatory Visit: Payer: Self-pay | Admitting: Internal Medicine

## 2021-08-17 DIAGNOSIS — M05731 Rheumatoid arthritis with rheumatoid factor of right wrist without organ or systems involvement: Secondary | ICD-10-CM

## 2021-08-19 NOTE — Progress Notes (Signed)
? ?Office Visit Note ? ?Patient: Julie [name]             ?Date of Birth: 23-Jul-1960           ?MRN: [id number]             ?PCP: Flossie Buffy, NP ?Referring: Flossie Buffy, NP ?Visit Date: 08/20/2021 ? ? ?Subjective:  ?No chief complaint on file. ? ? ?History of Present Illness: Julie [name] is a 61 y.o. female here for follow up for seropositive RA. She was taking HCQ but felt some jittery or tremulousness symptoms so interrupting taking this medication. She was taking diclofenac 75 mg as needed for joint inflammation but stopped this entirely since October last year and has done well off treatment. Joint swelling is improved she notices a mild amount persistent on volar aspect of right wrist. Wrist pain tends to be a bit worse with extended position such as her twice weekly yoga class. Anxiety symptoms she asked about with the plaquenil improved on their own without discontinuing the medication. ?  ? ?Previous HPI ?02/10/21 ?Julie [name] is a 61 y.o. female here for follow up for seropositive rheumatoid arthritis after restarting hydroxychloroquine 400 mg PO Monday-Friday after initial visit 8 weeks ago. Baseline labs obtained at initial visit showing normal sedimentation rate and negative for hepatitis and TB screening.  Starting the medicine she noticed a few areas of improvement.  Swelling has decreased on the radial aspect of the wrist though swelling continues on the ulnar side and down to the fifth finger.  She is no longer having pain radiating up from the wrist or triggering of her fingers.  Morning stiffness is decreased.  She also feels decreased pain and stiffness in the right shoulder and in bilateral knees.  She denies any problems taking the medication she tried increasing it to 10 tablets/week but went back down to once daily due to ease. ?  ?Previous HPI ?12/16/20 ?Julie [name] is a 61 y.o. female here for inflammatory arthritis of multiple sites with positive ANA and positive  CCP Abs. She was originally diagnosed in 2014 with inflammation of right second and third fingers and left second finger.  She has had concerns with systemic immunosuppression and side effects.  She started treatment with minocycline since 2014 she felt initially her symptoms got significantly worse before she did eventually feel some improvement.  She was also started on hydroxychloroquine that she took for about 2 years but has been off this since 2018.  Throughout this time she is continued intermittent oral NSAID use. Symptoms have been persistently in some amount of exacerbation since around March of this year. She was seen In sports medicine clinic with concern for right wrist synovitis and left knee medial meniscal injury and treatment with oral prednisone and diclofenac.  She did not notice very much change with 20 mg prednisone for 1 week duration. She has been taking diclofenac twice daily  recently due to the increased symptoms, also with right shoulder pain. She notices some stiffness and decreased mobility of her right hand fingers but only swelling in the wrist. ?  ?Labs reviewed ?03/2018 ?CCP >200 ?  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 ? ?Review of Systems  ?Constitutional:  Negative for fatigue.  ?HENT:  Negative for mouth dryness.   ?Eyes:  Negative for dryness.  ?Respiratory:  Negative for  shortness of breath.   ?Cardiovascular:  Negative for swelling in legs/feet.  ?Gastrointestinal:  Negative for constipation.  ?Endocrine: Negative for excessive thirst.  ?Genitourinary:  Negative for difficulty urinating.  ?Musculoskeletal:  Positive for morning stiffness.  ?Skin:  Negative for rash.  ?Allergic/Immunologic: Negative for susceptible to infections.  ?Neurological:  Negative for numbness.  ?Hematological:  Negative for bruising/bleeding  tendency.  ?Psychiatric/Behavioral:  Negative for sleep disturbance.   ? ?PMFS History:  ?Patient Active Problem List  ? Diagnosis Date Noted  ? High risk medication use 12/16/2020  ? Right wrist pain 10/01/2020  ? Left knee pain 10/01/2020  ? Prediabetes   ? Menopause 10/17/2017  ? 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 Hyperlipidemia 02/03/2017  ? Hypertension 02/03/2017  ? Impaired fasting glucose 02/03/2017  ? Synovitis of hand 12/27/2013  ? Multiple pigmented nevi 12/15/2013  ? Positive ANA (antinuclear antibody) 03/09/2013  ? Positive anti-CCP test 03/09/2013  ? Rheumatoid arthritis ([place]) 02/23/2013  ? Elevated cholesterol 12/12/2012  ?  ?Past Medical History:  ?Diagnosis Date  ? Allergy Early 2000s  ? Zithromax  ? Foot pain, bilateral   ? Hypercholesteremia   ? Hypertension   ? Prediabetes   ? Rheumatoid arthritis ([place])   ?  ?Family History  ?Problem Relation Age of Onset  ? Diabetes Mother   ? Hypertension Mother   ? Depression Mother   ? Asthma Mother   ? COPD Mother   ? Kidney disease Mother   ? Diabetes Father   ? Multiple myeloma Father   ? Cancer Father   ? Hypertension Father   ? Thyroid disease Sister   ? Hypertension Brother   ? Rheum arthritis Maternal Grandmother   ? Arthritis Maternal Grandmother   ? Rheum arthritis Maternal Uncle   ? Rheum arthritis Cousin   ? ?Past Surgical History:  ?Procedure Laterality Date  ? COLONOSCOPY  03/11/2012  ? (2) polyps  ? ?Social History  ? ?Social History Narrative  ? Not on file  ? ?Immunization History  ?Administered Date(s) Administered  ? DTaP 11/25/2010  ? Influenza Inj Mdck Quad Pf 02/14/2019  ? Influenza, Seasonal, Injecte, Preservative Fre 03/30/2013  ? Influenza-Unspecified 03/05/2021  ? PFIZER(Purple Top)SARS-COV-2 Vaccination 08/18/2019, 09/15/2019, 04/08/2020, 09/02/2020  ? Tdap 04/25/2021  ?  ? ?Objective: ?Vital Signs: BP (!) 174/98 (BP Location: Left Arm, Patient Position: Sitting, Cuff Size: Small)   Pulse 88   Resp 13   Ht  5' 4.5" (1.638 m)   Wt 180 lb 9.6 oz (81.9 kg)   LMP 12/02/2012 (Approximate)   BMI 30.52 kg/m?   ? ?Physical Exam ?HENT:  ?   Mouth/Throat:  ?   Mouth: Mucous membranes are moist.  ?   Pharynx: Oropharynx is clear.  ?Eyes:  ?   Conjunctiva/sclera: Conjunctivae normal.  ?Cardiovascular:  ?   Rate and Rhythm: Normal rate and regular rhythm.  ?   Heart sounds: Murmur heard.  ?Pulmonary:  ?   Effort: Pulmonary effort is normal.  ?   Breath sounds: Normal breath sounds.  ?Skin: ?   General: Skin is warm and dry.  ?   Findings: No rash.  ?   Comments: Multiple small, mobile, nontender subcutaneous nodule on forearm extensor surface  ?Neurological:  ?   Mental Status: She is alert.  ?  ? ?Musculoskeletal Exam:  ?Shoulders full ROM no tenderness or swelling ?Elbows full ROM no tenderness or swelling ?Right wrist mild swelling on flexor surface, left wrist decreased extension ROM no  tenderness or swelling ?Fingers full ROM no tenderness or swelling ?Knees full ROM no tenderness or swelling ?Ankles full ROM no tenderness or swelling ? ? ?CDAI Exam: ?CDAI Score: 3  ?Patient Global: 10 mm; Provider Global: 10 mm ?Swollen: 1 ; Tender: 0  ?Joint Exam 08/20/2021  ? ?   Right  Left  ?Wrist  Swollen      ? ? ? ?Investigation: ?No additional findings. ? ?Imaging: ?No results found. ? ?Recent Labs: ?Lab Results  ?Component Value Date  ? QFTBGOLDPLUS NEGATIVE 12/16/2020  ? ? ?Speciality CommentsTherisa Doyne Eye Exam [facility] 01/07/2021 WNL ?Follow-up: 12 months ? ? ?Procedures:  ?No procedures performed ?Allergies: Zithromax [azithromycin]  ? ?Assessment / Plan:     ?Visit Diagnoses: Rheumatoid arthritis involving right wrist with positive rheumatoid factor ([place]) ? ?Appears to be well controlled now, no longer requiring any routine NSAID dose.  Is some chronic swelling changes on exam not sure if this is hypertrophy or mild active inflammation.  Plan to continue hydroxychloroquine 200 mg daily.  She had OCT testing normal  states her eye doctor was curious about lack of request for visual field testing but no abnormalities reported.  She denies any visual changes. ? ?Positive anti-CCP test ?Positive ANA (antinuclear antibody) ? ?No ne

## 2021-08-20 ENCOUNTER — Encounter: Payer: Self-pay | Admitting: Internal Medicine

## 2021-08-20 ENCOUNTER — Ambulatory Visit (INDEPENDENT_AMBULATORY_CARE_PROVIDER_SITE_OTHER): Payer: Commercial Managed Care - PPO | Admitting: Internal Medicine

## 2021-08-20 VITALS — BP 174/98 | HR 88 | Resp 13 | Ht 64.5 in | Wt 180.6 lb

## 2021-08-20 DIAGNOSIS — R768 Other specified abnormal immunological findings in serum: Secondary | ICD-10-CM | POA: Diagnosis not present

## 2021-08-20 DIAGNOSIS — M05731 Rheumatoid arthritis with rheumatoid factor of right wrist without organ or systems involvement: Secondary | ICD-10-CM | POA: Diagnosis not present

## 2021-08-21 ENCOUNTER — Encounter: Payer: Self-pay | Admitting: Nurse Practitioner

## 2021-08-21 ENCOUNTER — Ambulatory Visit: Payer: Commercial Managed Care - PPO | Admitting: Nurse Practitioner

## 2021-08-21 VITALS — BP 110/68 | HR 84 | Temp 97.0°F | Ht 64.0 in | Wt 180.8 lb

## 2021-08-21 DIAGNOSIS — I10 Essential (primary) hypertension: Secondary | ICD-10-CM | POA: Diagnosis not present

## 2021-08-21 NOTE — Progress Notes (Signed)
? ?             Established Patient Visit ? ?Patient: Julie [name]   DOB: 04/27/1961   61 y.o. Female  MRN: [id number] ?Visit Date: 08/21/2021 ? ?Subjective:  ?  ?Chief Complaint  ?Patient presents with  ? Follow-up  ?  3 month f/u on HTN. Pt checks BP at home and states it has been normal ?Colonoscopy done in 2022, pt will sign release form ?Pap done within 3 years, letter sent to GYN for results.  ?  ? ?HPI ?Hypertension ?Home BP for last 34month: 110s-130s/70s-80s ?She has incorporated daily walking. ?BP Readings from Last 3 Encounters:  ?08/21/21 110/68  ?08/20/21 (!) 174/98  ?05/23/21 136/90  ? ?Maintain current dose of lisinopril ?F/up in 3-682month? ?Reviewed medical, surgical, and social history today ? ?Medications: ?Outpatient Medications Prior to Visit  ?Medication Sig  ? Alpha-Lipoic Acid 600 MG CAPS Take 600 mg by mouth 2 (two) times daily.  ? Ascorbic Acid (VITAMIN C PO) Take 2,000 mg by mouth 2 (two) times daily.  ? Barberry-Oreg Grape-Goldenseal (BERBERINE COMPLEX PO) Take by mouth.  ? beta carotene 25000 UNIT capsule Take 20,000 Units by mouth once a week. Vitamin A  ? Black Pepper-Turmeric (TURMERIC CURCUMIN) 09-998 MG CAPS Take 1,000 mg by mouth 2 (two) times daily.  ? Cholecalciferol (VITAMIN D PO) Take 10,000 Int'l Units by mouth daily.  ? diclofenac (VOLTAREN) 75 MG EC tablet Take 1 tablet (75 mg total) by mouth daily as needed for moderate pain.  ? hydroxychloroquine (PLAQUENIL) 200 MG tablet TAKE ONE TABLET BY MOUTH DAILY  ? lactobacillus acidophilus (BACID) TABS tablet Take 2 tablets by mouth daily.  ? lisinopril (ZESTRIL) 5 MG tablet Take 1 tablet (5 mg total) by mouth daily.  ? MAGNESIUM PO Take 400 mg by mouth. 5 days per week  ? MILK THISTLE PO Take 600 mg by mouth daily.  ? minocycline (MINOCIN,DYNACIN) 50 MG capsule Take by mouth. Takes '100mg'$  Mon, Wed, Fri  ? MISC NATURAL PRODUCTS PO Take by mouth. Holy [place]? Multiple Vitamins-Minerals (ZINC PO) Take by mouth.  ? Omega-3 Fatty  Acids (OMEGA-3 FISH OIL PO) Take by mouth.  ? ?No facility-administered medications prior to visit.  ? ?Reviewed past medical and social history.  ? ?ROS per HPI above ? ? ?   ?Objective:  ?BP 110/68 (BP Location: Left Arm, Patient Position: Sitting, Cuff Size: Normal)   Pulse 84   Temp (!) 97 ?F (36.1 ?C) (Temporal)   Ht '5\' 4"'$  (1.626 m)   Wt 180 lb 12.8 oz (82 kg)   LMP 12/02/2012 (Approximate)   SpO2 97%   BMI 31.03 kg/m?  ? ?  ? ?Physical Exam ?Constitutional:   ?   General: She is not in acute distress. ?Cardiovascular:  ?   Rate and Rhythm: Normal rate.  ?   Pulses: Normal pulses.  ?Pulmonary:  ?   Effort: Pulmonary effort is normal.  ?Musculoskeletal:  ?   Right lower leg: No edema.  ?   Left lower leg: No edema.  ?Neurological:  ?   Mental Status: She is alert and oriented to person, place, and time.  ?  ?No results found for any visits on 08/21/21. ?   ?Assessment & Plan:  ?  ?Problem List Items Addressed This Visit   ? ?  ? Cardiovascular and Mediastinum  ? Hypertension - Primary  ?  Home BP for last 49m76month110s-130s/70s-80s ?She has incorporated daily walking. ?  BP Readings from Last 3 Encounters:  ?08/21/21 110/68  ?08/20/21 (!) 174/98  ?05/23/21 136/90  ? ?Maintain current dose of lisinopril ?F/up in 3-70month ?  ?  ? ?Return in about 6 months (around 02/20/2022) for CPE (fasting). ? ?  ? ?CWilfred Lacy NP ? ? ?

## 2021-08-21 NOTE — Patient Instructions (Signed)
Maintain current medications  

## 2021-08-21 NOTE — Assessment & Plan Note (Addendum)
Home BP for last 43month: 110s-130s/70s-80s ?She has incorporated daily walking. ?BP Readings from Last 3 Encounters:  ?08/21/21 110/68  ?08/20/21 (!) 174/98  ?05/23/21 136/90  ? ?Maintain current dose of lisinopril ?F/up in 3-641month?

## 2021-10-16 NOTE — Progress Notes (Unsigned)
[facility] [place] Phone: [phone] Subjective:   [name], am serving as a scribe for Dr. Hulan Saas.  I'm seeing this patient by the request  of:  Nche, Charlene Brooke, NP  CC: left knee pain   OMA:[id number]  04/22/2021 Patient has responded extremely well at this moment.  Discussed icing regimen and home exercises.  I do think that Plaquenil has made a significant difference.  We discussed with continued inflammation noted in the wrist that we could consider the possibility of advanced imaging but patient declined.  Wants to continue conservative therapy and follow-up again in 6 months then if still doing well can follow-up as needed  Update 10/21/2021 Julie [name] is a Julie [name] coming in with complaint of L knee pain.  Hx of RA and was improving with the Plaquenil. Patient states that she has intermittent pain in knee but overall much better.   R wrist is also better. No pain but notes some chronic swelling over ventral aspect. Wears wrist brace for support.       Past Medical History:  Diagnosis Date   Allergy Early 2000s   Zithromax   Foot pain, bilateral    Hypercholesteremia    Hypertension    Prediabetes    Rheumatoid arthritis ([place])    Past Surgical History:  Procedure Laterality Date   COLONOSCOPY  03/11/2012   (2) polyps   Social History   Socioeconomic History   Marital status: Married    Spouse name: Not on file   Number of children: Not on file   Years of education: Not on file   Highest education level: Not on file  Occupational History   Not on file  Tobacco Use   Smoking status: Never    Passive exposure: Never   Smokeless tobacco: Never  Vaping Use   Vaping Use: Never used  Substance and Sexual Activity   Alcohol use: Never   Drug use: Never   Sexual activity: Not Currently    Partners: Male    Birth control/protection: Post-menopausal  Other Topics Concern    Not on file  Social History Narrative   Not on file   Social Determinants of Health   Financial Resource Strain: Not on file  Food Insecurity: Not on file  Transportation Needs: Not on file  Physical Activity: Not on file  Stress: Not on file  Social Connections: Not on file   Allergies  Allergen Reactions   Zithromax [Azithromycin] Hives   Family History  Problem Relation Age of Onset   Diabetes Mother    Hypertension Mother    Depression Mother    Asthma Mother    COPD Mother    Kidney disease Mother    Diabetes Father    Multiple myeloma Father    Cancer Father    Hypertension Father    Thyroid disease Sister    Hypertension Brother    Rheum arthritis Maternal Grandmother    Arthritis Maternal Grandmother    Rheum arthritis Maternal Uncle    Rheum arthritis Cousin      Current Outpatient Medications (Cardiovascular):    lisinopril (ZESTRIL) 5 MG tablet, Take 1 tablet (5 mg total) by mouth daily.   Current Outpatient Medications (Analgesics):    diclofenac (VOLTAREN) 75 MG EC tablet, Take 1 tablet (75 mg total) by mouth daily as needed for moderate pain.   Current Outpatient Medications (Other):    Alpha-Lipoic Acid 600  MG CAPS, Take 600 mg by mouth 2 (two) times daily.   Ascorbic Acid (VITAMIN C PO), Take 2,000 mg by mouth 2 (two) times daily.   Barberry-Oreg Grape-Goldenseal (BERBERINE COMPLEX PO), Take by mouth.   beta carotene 25000 UNIT capsule, Take 20,000 Units by mouth once a week. Vitamin A   Black Pepper-Turmeric (TURMERIC CURCUMIN) 09-998 MG CAPS, Take 1,000 mg by mouth 2 (two) times daily.   Cholecalciferol (VITAMIN D PO), Take 10,000 Int'l Units by mouth daily.   hydroxychloroquine (PLAQUENIL) 200 MG tablet, TAKE ONE TABLET BY MOUTH DAILY   lactobacillus acidophilus (BACID) TABS tablet, Take 2 tablets by mouth daily.   MAGNESIUM PO, Take 400 mg by mouth. 5 days per week   MILK THISTLE PO, Take 600 mg by mouth daily.   minocycline  (MINOCIN,DYNACIN) 50 MG capsule, Take by mouth. Takes 120m Mon, Wed, Fri   MISC NATURAL PRODUCTS PO, Take by mouth. Holy Basil   Multiple Vitamins-Minerals (ZINC PO), Take by mouth.   Omega-3 Fatty Acids (OMEGA-3 FISH OIL PO), Take by mouth.   Reviewed prior external information including notes and imaging from  primary care provider As well as notes that were available from care everywhere and other healthcare systems.  Patient has been going to integrative medicine at this moment as well.  Reviewed orders for the chronic joint pain in June.  Past medical history, social, surgical and family history all reviewed in electronic medical record.  No pertanent information unless stated regarding to the chief complaint.    Objective  Blood pressure (!) 142/92, pulse 60, height _0  (1.626 m), weight 183 lb (83 kg), last menstrual period 12/02/2012, SpO2 97 %.   General: No apparent distress alert and oriented x3 mood and affect normal, dressed appropriately.  HEENT: Pupils equal, extraocular movements intact  Respiratory: Patient's speak in full sentences and does not appear short of breath  Cardiovascular: No lower extremity edema, non tender, no erythema  Patient has a very mild swelling noted on the palmar aspect of the wrist.  Patient does have a full range of motion noted.  Good grip strength.  Neurovascularly intact.   Impression and Recommendations:     The above documentation has been reviewed and is accurate and complete ZLyndal Pulley DO

## 2021-10-21 ENCOUNTER — Ambulatory Visit: Payer: Commercial Managed Care - PPO | Admitting: Family Medicine

## 2021-10-21 ENCOUNTER — Encounter: Payer: Self-pay | Admitting: Family Medicine

## 2021-10-21 VITALS — BP 142/92 | HR 60 | Ht 64.0 in | Wt 183.0 lb

## 2021-10-21 DIAGNOSIS — M659 Synovitis and tenosynovitis, unspecified: Secondary | ICD-10-CM | POA: Diagnosis not present

## 2021-10-21 DIAGNOSIS — M25562 Pain in left knee: Secondary | ICD-10-CM

## 2021-10-21 NOTE — Assessment & Plan Note (Signed)
Patient could have recurrent synovitis but at the moment seems to be doing relatively well.  Discussed icing regimen and home for symptoms.  Discussed with patient that I would not change anything else up with her treatment options.  Seems to be doing very well with the Plaquenil with no signal

## 2021-10-21 NOTE — Patient Instructions (Signed)
Good to see you! Making great strides Lets not change anything up Send any messages for flares See me when you need me

## 2021-11-17 ENCOUNTER — Other Ambulatory Visit: Payer: Self-pay | Admitting: Nurse Practitioner

## 2021-11-17 DIAGNOSIS — I10 Essential (primary) hypertension: Secondary | ICD-10-CM

## 2021-11-17 NOTE — Telephone Encounter (Signed)
Chart supports Rx Last OV: 08/2021 Next OV: 02/2022

## 2022-02-19 NOTE — Progress Notes (Deleted)
Office Visit Note  Patient: Julie [name]             Date of Birth: 27-May-1960           MRN: [id number]             PCP: Flossie Buffy, NP Referring: Flossie Buffy, NP Visit Date: 02/24/2022   Subjective:  No chief complaint on file.   History of Present Illness: Julie [name] is a 61 y.o. female here for follow up for seropositive RA   Previous HPI 02/19/2022  [name] is a 61 y.o. female here for follow up for seropositive RA. She was taking HCQ but felt some jittery or tremulousness symptoms so interrupting taking this medication. She was taking diclofenac 75 mg as needed for joint inflammation but stopped this entirely since October last year and has done well off treatment. Joint swelling is improved she notices a mild amount persistent on volar aspect of right wrist. Wrist pain tends to be a bit worse with extended position such as her twice weekly yoga class. Anxiety symptoms she asked about with the plaquenil improved on their own without discontinuing the medication.     Previous HPI 02/10/21 Julie [name] is a 61 y.o. female here for follow up for seropositive rheumatoid arthritis after restarting hydroxychloroquine 400 mg PO Monday-Friday after initial visit 8 weeks ago. Baseline labs obtained at initial visit showing normal sedimentation rate and negative for hepatitis and TB screening.  Starting the medicine she noticed a few areas of improvement.  Swelling has decreased on the radial aspect of the wrist though swelling continues on the ulnar side and down to the fifth finger.  She is no longer having pain radiating up from the wrist or triggering of her fingers.  Morning stiffness is decreased.  She also feels decreased pain and stiffness in the right shoulder and in bilateral knees.  She denies any problems taking the medication she tried increasing it to 10 tablets/week but went back down to once daily due to ease.   Previous HPI 12/16/20 Julie [name] is a 61 y.o. female here for inflammatory arthritis of multiple sites with positive ANA and positive CCP Abs. She was originally diagnosed in 2014 with inflammation of right second and third fingers and left second finger.  She has had concerns with systemic immunosuppression and side effects.  She started treatment with minocycline since 2014 she felt initially her symptoms got significantly worse before she did eventually feel some improvement.  She was also started on hydroxychloroquine that she took for about 2 years but has been off this since 2018.  Throughout this time she is continued intermittent oral NSAID use. Symptoms have been persistently in some amount of exacerbation since around March of this year. She was seen In sports medicine clinic with concern for right wrist synovitis and left knee medial meniscal injury and treatment with oral prednisone and diclofenac.  She did not notice very much change with 20 mg prednisone for 1 week duration. She has been taking diclofenac twice daily  recently due to the increased symptoms, also with right shoulder pain. She notices some stiffness and decreased mobility of her right hand fingers but only swelling in the wrist.   Labs reviewed 03/2018 CCP >200   Imaging reviewed 09/2020 Xray right wrist No acute injury, no significant arthritis changes 09/2020 Xray left knee Mild osteoarthritis without effusion or erosions or abnormal calcifications   No Rheumatology ROS completed.   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   Hyperlipidemia 02/03/2017   Hypertension 02/03/2017   Impaired fasting glucose 02/03/2017   Synovitis of hand 12/27/2013   Multiple pigmented nevi 12/15/2013   Positive ANA (antinuclear antibody)  03/09/2013   Positive anti-CCP test 03/09/2013   Rheumatoid arthritis ([place]) 02/23/2013   Elevated cholesterol 12/12/2012    Past Medical History:  Diagnosis Date   Allergy Early 2000s   Zithromax   Foot pain, bilateral    Hypercholesteremia    Hypertension    Prediabetes    Rheumatoid arthritis ([place])     Family History  Problem Relation Age of Onset   Diabetes Mother    Hypertension Mother    Depression Mother    Asthma Mother    COPD Mother    Kidney disease Mother    Diabetes Father    Multiple myeloma Father    Cancer Father    Hypertension Father    Thyroid disease Sister    Hypertension Brother    Rheum arthritis Maternal Grandmother    Arthritis Maternal Grandmother    Rheum arthritis Maternal Uncle    Rheum arthritis Cousin    Past Surgical History:  Procedure Laterality Date   COLONOSCOPY  03/11/2012   (2) polyps   Social History   Social History Narrative   Not on file   Immunization History  Administered Date(s) Administered   DTaP 11/25/2010   Influenza Inj Mdck Quad Pf 02/14/2019   Influenza, Seasonal, Injecte, Preservative Fre 03/30/2013   Influenza-Unspecified 03/05/2021   PFIZER(Purple Top)SARS-COV-2 Vaccination 08/18/2019, 09/15/2019, 04/08/2020, 09/02/2020   Tdap 04/25/2021     Objective: Vital Signs: LMP 12/02/2012 (Approximate)    Physical Exam   Musculoskeletal Exam: ***  CDAI Exam: CDAI Score: -- Patient Global: --; Provider Global: -- Swollen: --; Tender: -- Joint Exam 02/24/2022   No joint exam has been documented for this visit   There is currently no information documented on the homunculus. Go to the Rheumatology activity and complete the homunculus joint exam.  Investigation: No additional findings.  Imaging: No results found.  Recent Labs: Lab Results  Component Value Date   QFTBGOLDPLUS NEGATIVE 12/16/2020    Speciality Comments: PLQ Eye Exam [facility] 01/06/2022 WNL Follow-up: 12  months   Procedures:  No procedures performed Allergies: Zithromax [azithromycin]   Assessment / Plan:     Visit Diagnoses: No diagnosis found.  ***  Orders: No orders of the defined types were placed in this encounter.  No orders of the defined types were placed in this encounter.    Follow-Up Instructions: No follow-ups on file.   Bertram Savin, RT  Note - This record has been created using Editor, commissioning.  Chart creation errors have been sought, but may not always  have been located. Such creation errors do not reflect on  the standard of medical care.

## 2022-02-20 ENCOUNTER — Ambulatory Visit (INDEPENDENT_AMBULATORY_CARE_PROVIDER_SITE_OTHER): Payer: Commercial Managed Care - PPO | Admitting: Nurse Practitioner

## 2022-02-20 ENCOUNTER — Encounter: Payer: Self-pay | Admitting: Nurse Practitioner

## 2022-02-20 VITALS — BP 124/82 | HR 92 | Temp 98.0°F | Ht 64.5 in | Wt 182.6 lb

## 2022-02-20 DIAGNOSIS — Z23 Encounter for immunization: Secondary | ICD-10-CM

## 2022-02-20 DIAGNOSIS — Z0000 Encounter for general adult medical examination without abnormal findings: Secondary | ICD-10-CM | POA: Diagnosis not present

## 2022-02-20 NOTE — Patient Instructions (Addendum)
Sign medical release to get Colonoscopy report from [facility].  Let me know if sleep disturbance worsens Let me know if you need referral to psychologist

## 2022-02-20 NOTE — Progress Notes (Signed)
Complete physical exam  Patient: Julie [name]   DOB: May 28, 1960   61 y.o. Female  MRN: [id number] Visit Date: 02/20/2022  Subjective:    Chief Complaint  Patient presents with   Annual Exam    CPE no other concerns Pap will be done on 03/12/22   Julie [name] is a 61 y.o. female who presents today for a complete physical exam. She reports consuming a low fat diet.  Walking daily and yoga 2x/week  She generally feels well. She reports sleeping fairly well. She does not have additional problems to discuss today.  Vision:Yes Dental:Yes STD Screen:No  Most recent fall risk assessment:    02/20/2022    9:53 AM  Fall Risk   Falls in the past year? 0  Number falls in past yr: 0  Injury with Fall? 0     Most recent depression screenings:    02/20/2022   11:37 AM 02/20/2022    9:53 AM  PHQ 2/9 Scores  PHQ - 2 Score 0 0  PHQ- 9 Score 1     HPI  No problem-specific Assessment & Plan notes found for this encounter.   Past Medical History:  Diagnosis Date   Allergy Early 2000s   Zithromax   Foot pain, bilateral    Hypercholesteremia    Hypertension    Prediabetes    Rheumatoid arthritis ([place])    Past Surgical History:  Procedure Laterality Date   COLONOSCOPY  03/11/2012   (2) polyps   Social History   Socioeconomic History   Marital status: Married    Spouse name: Not on file   Number of children: Not on file   Years of education: Not on file   Highest education level: Not on file  Occupational History   Not on file  Tobacco Use   Smoking status: Never    Passive exposure: Never   Smokeless tobacco: Never  Vaping Use   Vaping Use: Never used  Substance and Sexual Activity   Alcohol use: Never   Drug use: Never   Sexual activity: Not Currently    Partners: Male    Birth control/protection: Post-menopausal  Other Topics Concern   Not on file  Social History Narrative   Not on file   Social Determinants of Health   Financial Resource  Strain: Not on file  Food Insecurity: Not on file  Transportation Needs: Not on file  Physical Activity: Not on file  Stress: Not on file  Social Connections: Not on file  Intimate Partner Violence: Not on file   Family Status  Relation Name Status   Mother Kendrick Fries Deceased   Father Agricultural consultant Deceased   Sister  [place] Deceased   Cousin Maternal Cousin Alive   Family History  Problem Relation Age of Onset   Diabetes Mother    Hypertension Mother    Depression Mother    Asthma Mother    COPD Mother    Kidney disease Mother    Diabetes Father    Multiple myeloma Father    Hypertension Father    Thyroid disease Sister    Hypertension Brother    Rheum arthritis Maternal Uncle    Rheum arthritis Maternal Grandmother    Arthritis Maternal Grandmother    Rheum arthritis Cousin    Allergies  Allergen Reactions   Zithromax [Azithromycin] Hives    Patient Care Team: Khizar Fiorella, Charlene Brooke, NP as PCP -  General (Internal Medicine) Salvadore Dom, MD as Consulting Physician (Obstetrics and Gynecology)   Medications: Outpatient Medications Prior to Visit  Medication Sig   Alpha-Lipoic Acid 600 MG CAPS Take 600 mg by mouth 2 (two) times daily.   Ascorbic Acid (VITAMIN C PO) Take 2,000 mg by mouth 2 (two) times daily.   Barberry-Oreg Grape-Goldenseal (BERBERINE COMPLEX PO) Take by mouth.   beta carotene 25000 UNIT capsule Take 20,000 Units by mouth once a week. Vitamin A   Black Pepper-Turmeric (TURMERIC CURCUMIN) 09-998 MG CAPS Take 1,000 mg by mouth 2 (two) times daily.   Cholecalciferol (VITAMIN D PO) Take 10,000 Int'l Units by mouth daily.   diclofenac (VOLTAREN) 75 MG EC tablet Take 1 tablet (75 mg total) by mouth daily as needed for moderate pain.   hydroxychloroquine (PLAQUENIL) 200 MG tablet TAKE ONE TABLET BY MOUTH DAILY   hydroxychloroquine (PLAQUENIL) 200 MG tablet Take by mouth.   lactobacillus acidophilus (BACID)  TABS tablet Take 2 tablets by mouth daily.   lisinopril (ZESTRIL) 5 MG tablet TAKE ONE TABLET BY MOUTH DAILY   lisinopril (ZESTRIL) 5 MG tablet Take 1 tablet by mouth daily.   MAGNESIUM PO Take 400 mg by mouth. 5 days per week   MILK THISTLE PO Take 600 mg by mouth daily.   minocycline (MINOCIN,DYNACIN) 50 MG capsule Take by mouth. Takes 136m Mon, Wed, Fri   MISC NATURAL PRODUCTS PO Take by mouth. Holy Basil   Multiple Vitamins-Minerals (ZINC PO) Take by mouth.   Omega-3 Fatty Acids (OMEGA-3 FISH OIL PO) Take by mouth.   Thiamine HCl (B-1 PO)    No facility-administered medications prior to visit.   Review of Systems  Constitutional:  Negative for fever.  HENT:  Negative for congestion and sore throat.   Eyes:        Negative for visual changes  Respiratory:  Negative for cough and shortness of breath.   Cardiovascular:  Negative for chest pain, palpitations and leg swelling.  Gastrointestinal:  Negative for blood in stool, constipation and diarrhea.  Genitourinary:  Negative for dysuria, frequency and urgency.  Musculoskeletal:  Negative for myalgias.  Skin:  Negative for rash.  Neurological:  Negative for dizziness and headaches.  Hematological:  Does not bruise/bleed easily.  Psychiatric/Behavioral:  Negative for suicidal ideas. The patient is not nervous/anxious.        Objective:  BP 124/82 (BP Location: Left Arm, Patient Position: Sitting, Cuff Size: Normal)   Pulse 92   Temp 98 F (36.7 C) (Temporal)   Ht 5' 4.5" (1.638 m)   Wt 182 lb 9.6 oz (82.8 kg)   LMP 12/02/2012 (Approximate)   SpO2 98%   BMI 30.86 kg/m     BP Readings from Last 3 Encounters:  02/20/22 124/82  10/21/21 (!) 142/92  08/21/21 110/68   Wt Readings from Last 3 Encounters:  02/20/22 182 lb 9.6 oz (82.8 kg)  10/21/21 183 lb (83 kg)  08/21/21 180 lb 12.8 oz (82 kg)   Physical Exam Vitals reviewed.  Constitutional:      General: She is not in acute distress. HENT:     Right Ear: Tympanic  membrane, ear canal and external ear normal.     Left Ear: Tympanic membrane, ear canal and external ear normal.     Nose: Nose normal.  Eyes:     General: No scleral icterus.    Extraocular Movements: Extraocular movements intact.     Conjunctiva/sclera: Conjunctivae normal.  Cardiovascular:  Rate and Rhythm: Normal rate and regular rhythm.     Pulses: Normal pulses.     Heart sounds: Normal heart sounds.  Pulmonary:     Effort: Pulmonary effort is normal. No respiratory distress.     Breath sounds: Normal breath sounds.  Abdominal:     General: Bowel sounds are normal. There is no distension.     Palpations: Abdomen is soft.  Musculoskeletal:        General: Normal range of motion.     Cervical back: Normal range of motion and neck supple.     Right lower leg: No edema.     Left lower leg: No edema.  Lymphadenopathy:     Cervical: No cervical adenopathy.  Skin:    General: Skin is warm and dry.  Neurological:     Mental Status: She is alert and oriented to person, place, and time.  Psychiatric:        Mood and Affect: Mood normal.        Behavior: Behavior normal.     No results found for any visits on 02/20/22.    Assessment & Plan:    Routine Health Maintenance and Physical Exam  Immunization History  Administered Date(s) Administered   DTaP 11/25/2010   Influenza Inj Mdck Quad Pf 02/14/2019   Influenza, High Dose Seasonal PF 04/24/2011, 03/19/2012   Influenza, Seasonal, Injecte, Preservative Fre 03/30/2013   Influenza,inj,Quad PF,6+ Mos 03/30/2013, 02/20/2022   Influenza-Unspecified 03/05/2021   PFIZER(Purple Top)SARS-COV-2 Vaccination 08/18/2019, 09/15/2019, 04/08/2020, 09/02/2020   Tdap 04/25/2021    Health Maintenance  Topic Date Due   Zoster Vaccines- Shingrix (1 of 2) Never done   COLONOSCOPY (Pts 45-55yr Insurance coverage will need to be confirmed)  Never done   COVID-19 Vaccine (5 - Pfizer risk series) 10/28/2020   PAP SMEAR-Modifier   03/12/2022 (Originally 02/16/2021)   HIV Screening  02/21/2023 (Originally 06[birth year]10/1973)   MAMMOGRAM  03/29/2023   TETANUS/TDAP  04/26/2031   INFLUENZA VACCINE  Completed   Hepatitis C Screening  Completed   HPV VACCINES  Aged Out   Discussed health benefits of physical activity, and encouraged her to engage in regular exercise appropriate for her age and condition. We reviewed lab results completed by Dr. BOwens Sharkwith [facility] 10/2021: CBC, CMP, TSH, lipid panel, and HGBA1c (all normal). [facility]  Problem List Items Addressed This Visit   None Visit Diagnoses     Preventative health care    -  Primary   Need for immunization against influenza       Relevant Orders   Flu Vaccine QUAD 6+ mos PF IM (Fluarix Quad PF) (Completed)      Return in about 1 year (around 02/21/2023) for CPE (fasting).     CWilfred Lacy NP

## 2022-02-23 ENCOUNTER — Telehealth: Payer: Self-pay | Admitting: Nurse Practitioner

## 2022-02-23 NOTE — Telephone Encounter (Signed)
Forms have been signed and placed up front ready for pt. Pt advised

## 2022-02-23 NOTE — Telephone Encounter (Signed)
Caller Name: Tyjae Shvartsman Call back phone #: [phone]  Reason for Call: Pt dropped off forms, she was here on 10/13 for a cpe with [facility] and forgot to bring with her. She needs it filled out for insurance purposes, requested a call once it has been completed and ready for pick up.

## 2022-02-24 ENCOUNTER — Ambulatory Visit: Payer: Commercial Managed Care - PPO | Admitting: Internal Medicine

## 2022-02-24 ENCOUNTER — Other Ambulatory Visit: Payer: Self-pay | Admitting: Internal Medicine

## 2022-02-24 DIAGNOSIS — Z79899 Other long term (current) drug therapy: Secondary | ICD-10-CM

## 2022-02-24 DIAGNOSIS — R768 Other specified abnormal immunological findings in serum: Secondary | ICD-10-CM

## 2022-02-24 DIAGNOSIS — M05731 Rheumatoid arthritis with rheumatoid factor of right wrist without organ or systems involvement: Secondary | ICD-10-CM

## 2022-02-24 NOTE — Telephone Encounter (Addendum)
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

## 2022-02-24 NOTE — Progress Notes (Signed)
Office Visit Note  Patient: Julie [name]             Date of Birth: [birth year]           MRN: [id number]             PCP: Flossie Buffy, NP Referring: Flossie Buffy, NP Visit Date: 03/03/2022   Subjective:  Follow-up (Feeling pretty good today.)   History of Present Illness: Julie [name] is a 61 y.o. 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 Symptoms are doing well overall with no significant morning stiffness. She has a small area of persistent swelling on th flexor side proximal to her right wrist but no associated pain. She estimates needing to take NSAIDs only once since last visit. She has some clicking or popping in the medial side of her left knee also not particularly painful.   Previous HPI 08/20/2021  Julie [name] is a 62 y.o. female here for follow up for seropositive RA. She was taking HCQ but felt some jittery or tremulousness symptoms so interrupting taking this medication. She was taking diclofenac 75 mg as needed for joint inflammation but stopped this entirely since October last year and has done well off treatment. Joint swelling is improved she notices a mild amount persistent on volar aspect of right wrist. Wrist pain tends to be a bit worse with extended position such as her twice weekly yoga class. Anxiety symptoms she asked about with the plaquenil improved on their own without discontinuing the medication.     Previous HPI 02/10/21 Julie [name] is a 61 y.o. female here for follow up for seropositive rheumatoid arthritis after restarting hydroxychloroquine 400 mg PO Monday-Friday after initial visit 8 weeks ago. Baseline labs obtained at initial visit showing normal sedimentation rate and negative for hepatitis and TB screening.  Starting the medicine she noticed a few areas of improvement.  Swelling has decreased on the radial aspect of the wrist though swelling continues on the ulnar side and down to the fifth finger.   She is no longer having pain radiating up from the wrist or triggering of her fingers.  Morning stiffness is decreased.  She also feels decreased pain and stiffness in the right shoulder and in bilateral knees.  She denies any problems taking the medication she tried increasing it to 10 tablets/week but went back down to once daily due to ease.   Previous HPI 12/16/20 Julie [name] is a 61 y.o. female here for inflammatory arthritis of multiple sites with positive ANA and positive CCP Abs. She was originally diagnosed in 2014 with inflammation of right second and third fingers and left second finger.  She has had concerns with systemic immunosuppression and side effects.  She started treatment with minocycline since 2014 she felt initially her symptoms got significantly worse before she did eventually feel some improvement.  She was also started on hydroxychloroquine that she took for about 2 years but has been off this since 2018.  Throughout this time she is continued intermittent oral NSAID use. Symptoms have been persistently in some amount of exacerbation since around March of this year. She was seen In sports medicine clinic with concern for right wrist synovitis and left knee medial meniscal injury and treatment with oral prednisone and diclofenac.  She did not notice very much change with 20 mg prednisone for 1 week duration. She has been taking diclofenac twice daily  recently due to the increased symptoms, also with  right shoulder pain. She notices some stiffness and decreased mobility of her right hand fingers but only swelling in the wrist.   Labs reviewed 03/2018 CCP >200   Imaging reviewed 09/2020 Xray right wrist No acute injury, no significant arthritis changes 09/2020 Xray left knee Mild osteoarthritis without effusion or erosions or abnormal calcifications   Review of Systems  HENT:  Negative for mouth sores and mouth dryness.   Eyes:  Negative for dryness.  Respiratory:  Negative  for shortness of breath.   Cardiovascular:  Negative for chest pain and palpitations.  Gastrointestinal:  Positive for constipation. Negative for blood in stool and diarrhea.  Endocrine: Negative for increased urination.  Genitourinary:  Negative for involuntary urination.  Musculoskeletal:  Positive for morning stiffness. Negative for joint pain, gait problem, joint pain, joint swelling, myalgias, muscle weakness, muscle tenderness and myalgias.  Skin:  Negative for color change, rash, hair loss and sensitivity to sunlight.  Allergic/Immunologic: Negative for susceptible to infections.  Neurological:  Negative for dizziness and headaches.  Hematological:  Negative for swollen glands.  Psychiatric/Behavioral:  Positive for sleep disturbance. Negative for depressed mood. The patient is nervous/anxious.     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   Hypertension 02/03/2017   Impaired fasting glucose 02/03/2017   Synovitis of hand 12/27/2013   Multiple pigmented nevi 12/15/2013   Positive ANA (antinuclear antibody) 03/09/2013   Positive anti-CCP test 03/09/2013   Rheumatoid arthritis ([place]) 02/23/2013    Past Medical History:  Diagnosis Date   Allergy Early 2000s   Zithromax   Foot pain, bilateral    Hypercholesteremia    Hypertension    Prediabetes    Rheumatoid arthritis ([place])     Family History  Problem Relation Age of Onset   Diabetes Mother    Hypertension Mother    Depression Mother    Asthma Mother    COPD Mother    Kidney disease Mother    Diabetes Father    Multiple myeloma Father    Hypertension Father    Thyroid disease Sister    Hypertension Brother    Rheum arthritis Maternal Uncle    Rheum arthritis Maternal Grandmother    Arthritis  Maternal Grandmother    Rheum arthritis Cousin    Past Surgical History:  Procedure Laterality Date   COLONOSCOPY  03/11/2012   (2) polyps   Social History   Social History Narrative   Not on file   Immunization History  Administered Date(s) Administered   DTaP 11/25/2010   Influenza Inj Mdck Quad Pf 02/14/2019   Influenza, High Dose Seasonal PF 04/24/2011, 03/19/2012   Influenza, Seasonal, Injecte, Preservative Fre 03/30/2013   Influenza,inj,Quad PF,6+ Mos 03/30/2013, 02/20/2022   Influenza-Unspecified 03/05/2021   PFIZER(Purple Top)SARS-COV-2 Vaccination 08/18/2019, 09/15/2019, 04/08/2020, 09/02/2020   Tdap 04/25/2021     Objective: Vital Signs: BP (!) 153/86 (BP Location: Left Arm, Patient Position: Sitting, Cuff Size: Normal)   Pulse 81   Resp 14   Ht 5' 4.5" (1.638 m)   Wt 184 lb 3.2 oz (83.6 kg)   LMP 12/02/2012 (Approximate)   BMI 31.13 kg/m    Physical Exam Cardiovascular:     Rate and Rhythm: Normal rate and regular rhythm.  Pulmonary:     Effort: Pulmonary effort is  normal.     Breath sounds: Normal breath sounds.  Skin:    General: Skin is warm and dry.     Findings: No rash.  Neurological:     Mental Status: She is alert.  Psychiatric:        Mood and Affect: Mood normal.      Musculoskeletal Exam:  Shoulders full ROM no tenderness or swelling Elbows full ROM no tenderness or swelling Wrists full ROM no tenderness or swelling, small area of soft tissue swelling on flexor side on right wrist, no definite joint synovitis Fingers full ROM no tenderness or swelling Knees full ROM no tenderness or swelling, left knee crepitus present tissue clicking at medial anterior joint line with flexion and extension Ankles full ROM no tenderness or swelling   CDAI Exam: CDAI Score: 3  Patient Global: 10 mm; Provider Global: 10 mm Swollen: 1 ; Tender: 0  Joint Exam 03/03/2022      Right  Left  Wrist  Swollen         Investigation: No additional  findings.  Imaging: No results found.  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  Suspect the right wrist changes may be chronic soft tissue changes from previous inflammation rather than active synovitis.  We will continue current treatment and can use infrequent NSAIDs as needed.  High risk medication use - hydroxychloroquine 200 mg daily  Labs reviewed from June with normal CBC and CMP.  She had a minimally elevated noncardiac CRP but this has been the case for her in the past even during disease remission.  Most recent eye exam from August looks fine follow-up 1 year.   Orders: No orders of the defined types were placed in this encounter.  No orders of the defined types were placed in this encounter.    Follow-Up Instructions: Return in about 6 months (around 09/02/2022) for RA on HCQ f/u 24mo.   CCollier Salina MD  Note - This record has been created using [employer]  Chart creation errors have been sought, but may not always  have been located. Such creation errors do not reflect on  the standard of medical care.

## 2022-02-25 ENCOUNTER — Encounter: Payer: Self-pay | Admitting: Internal Medicine

## 2022-03-01 IMAGING — DX DG WRIST COMPLETE 3+V*R*
4 series · 4 of 4 positions shown · non-contrast
Comparison: None.

CLINICAL DATA: Right wrist pain for 2 months, no known injury,
initial encounter

EXAM:
RIGHT WRIST - COMPLETE 3+ VIEW

[wrist ap]
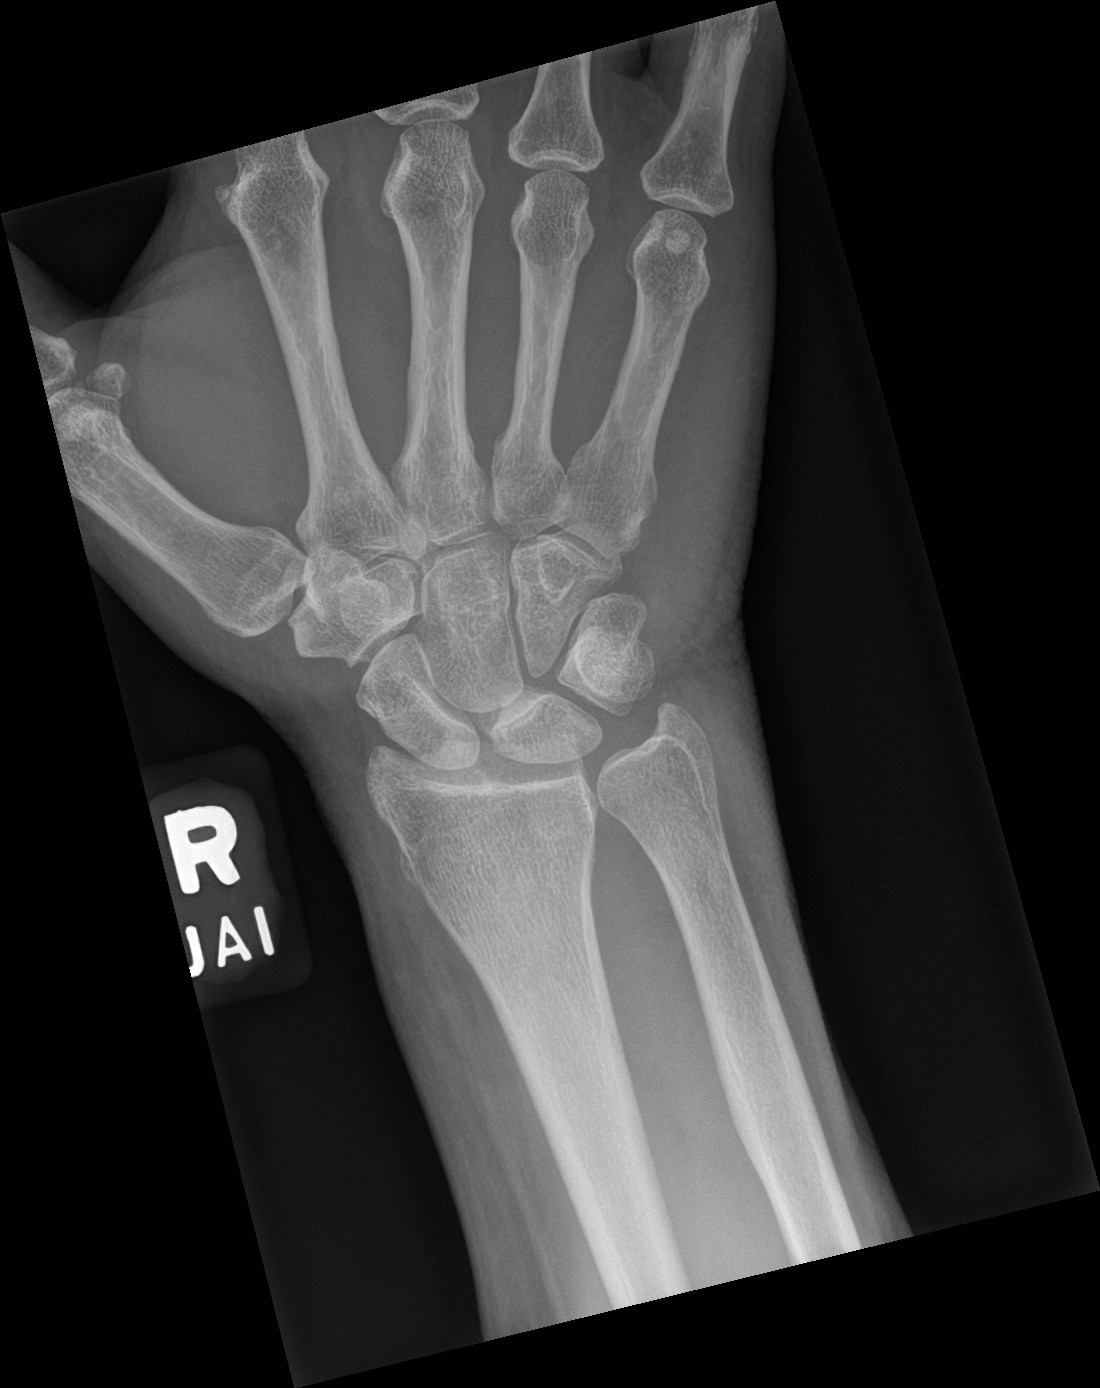

[wrist obl]
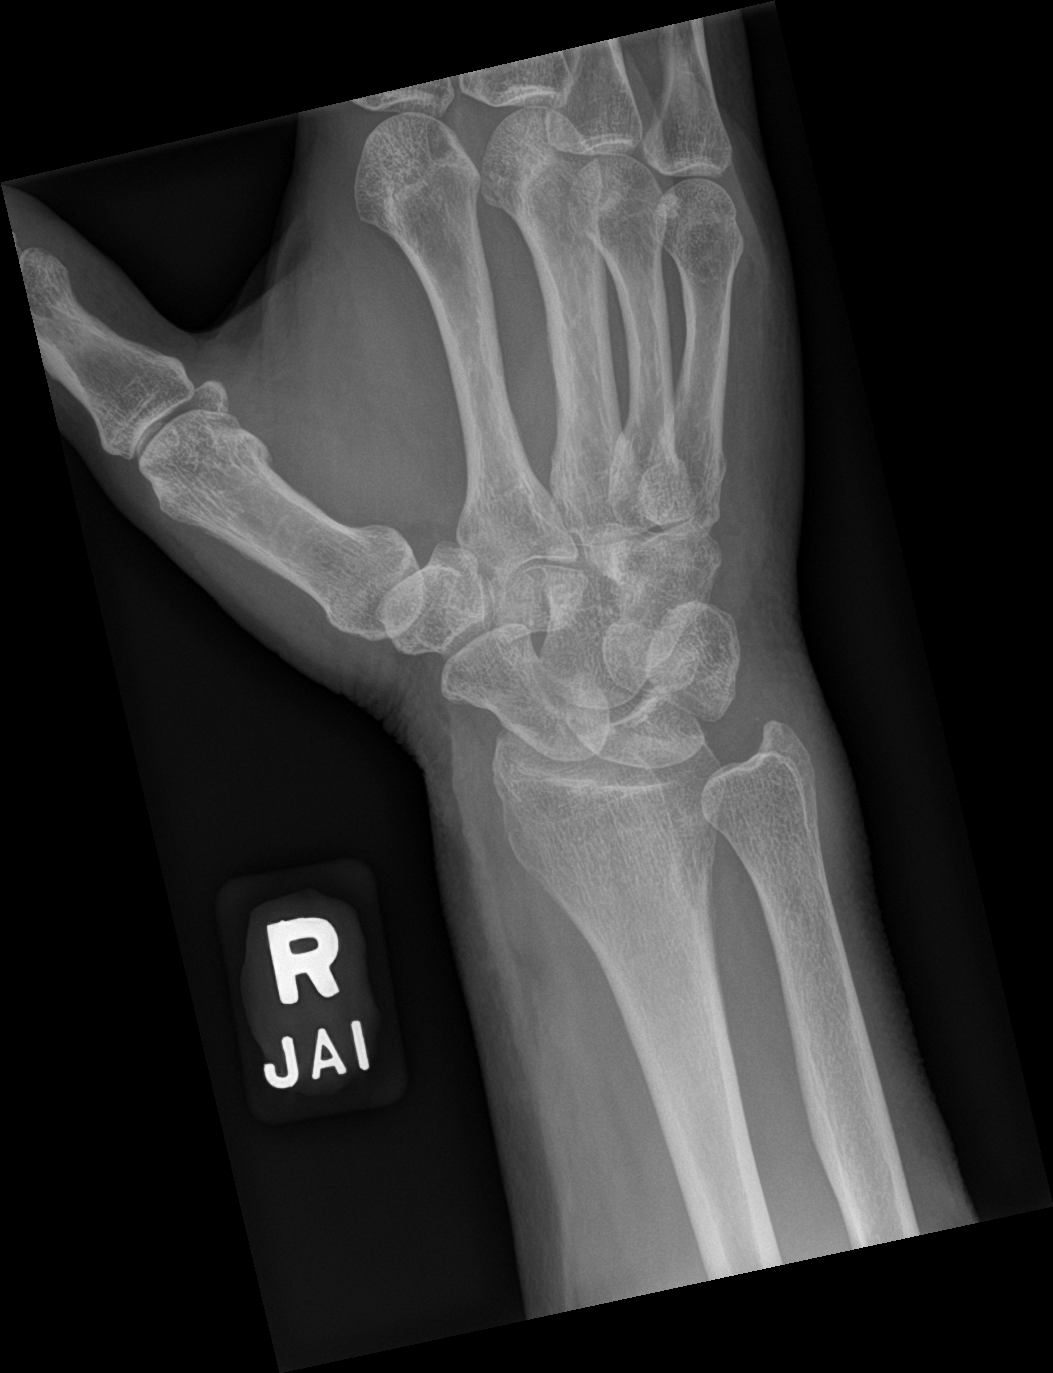

[wrist lat]
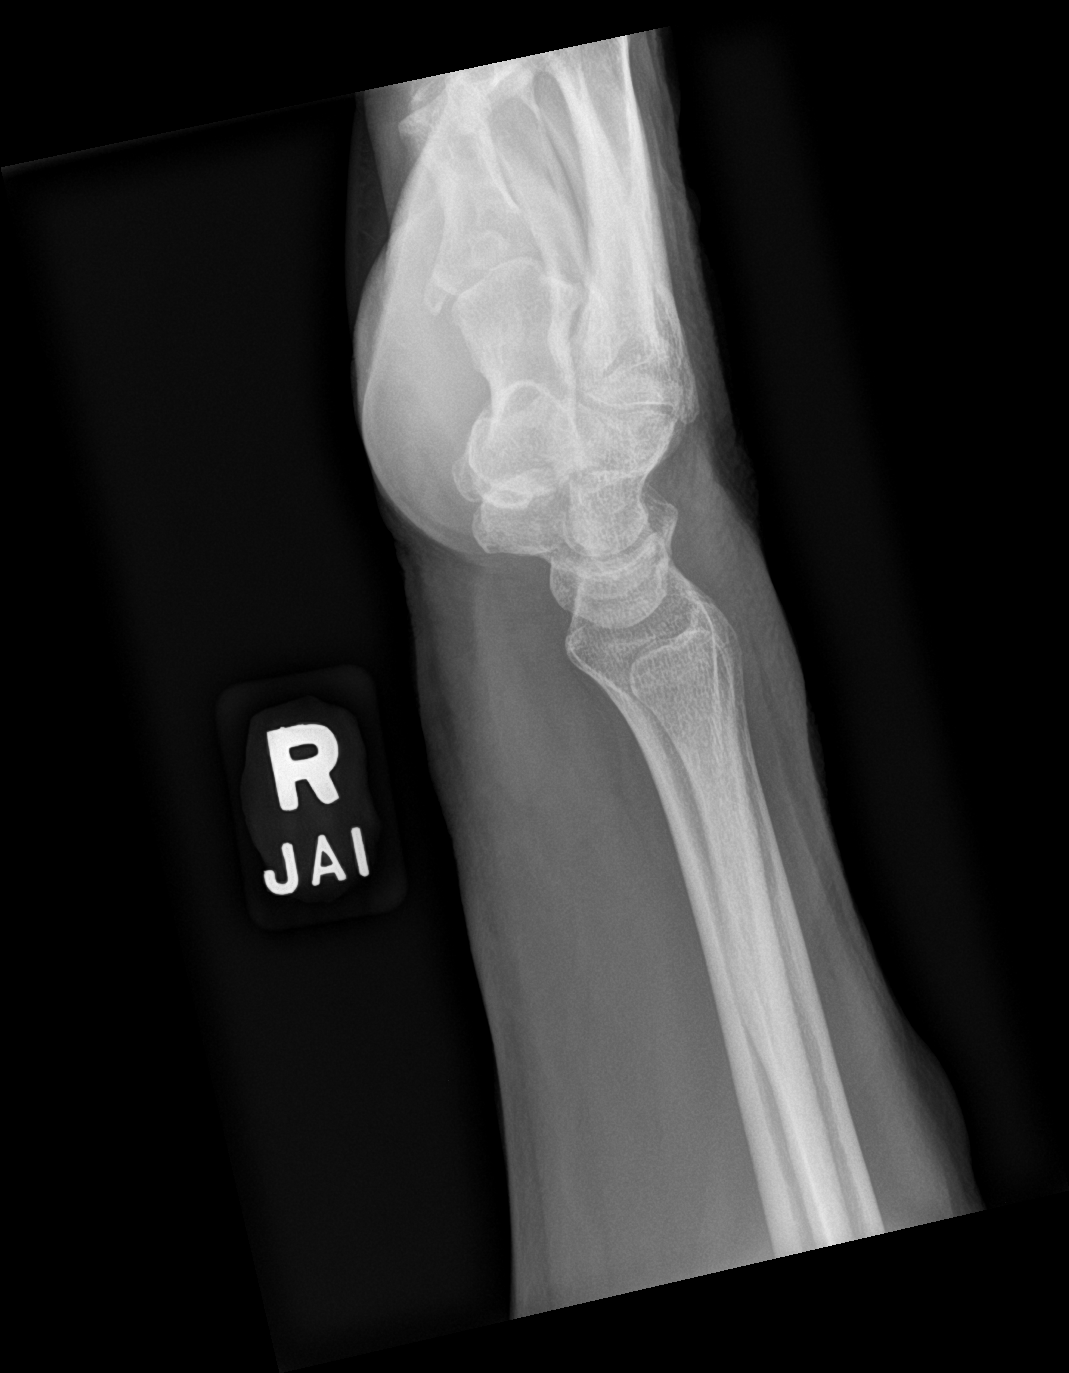

[scaphoid]
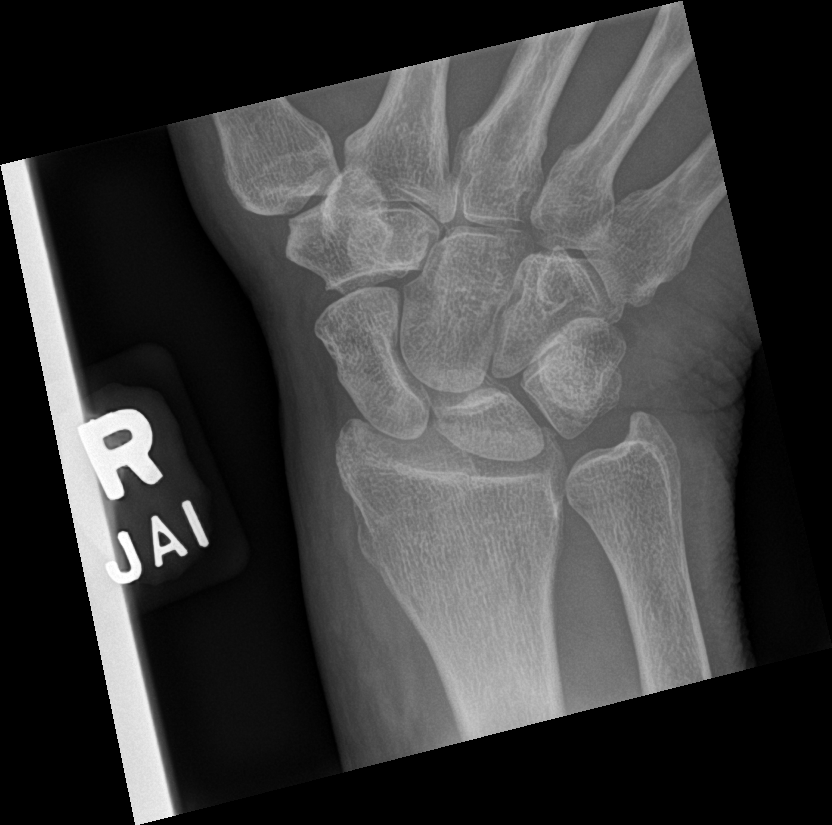

[4 of 4 positions shown; findings below may reference images not displayed]

FINDINGS: There is no evidence of fracture or dislocation. There is no
evidence of arthropathy or other focal bone abnormality. Soft
tissues are unremarkable.
IMPRESSION: No acute abnormality noted.

## 2022-03-03 ENCOUNTER — Ambulatory Visit: Payer: Commercial Managed Care - PPO | Attending: Internal Medicine | Admitting: Internal Medicine

## 2022-03-03 ENCOUNTER — Encounter: Payer: Self-pay | Admitting: Internal Medicine

## 2022-03-03 VITALS — BP 153/86 | HR 81 | Resp 14 | Ht 64.5 in | Wt 184.2 lb

## 2022-03-03 DIAGNOSIS — R768 Other specified abnormal immunological findings in serum: Secondary | ICD-10-CM

## 2022-03-03 DIAGNOSIS — M05731 Rheumatoid arthritis with rheumatoid factor of right wrist without organ or systems involvement: Secondary | ICD-10-CM

## 2022-03-03 DIAGNOSIS — Z79899 Other long term (current) drug therapy: Secondary | ICD-10-CM | POA: Diagnosis not present

## 2022-03-09 NOTE — Progress Notes (Unsigned)
61 y.o. G0P0 Married White or Caucasian Not Hispanic or Latino female here for annual exam.  Not sexually active. No vaginal bleeding.    She has a known grade 2 uterine prolapse. Just in the last week or so she has noticed a sensation at the opening of her vagina as though a tampon is part way in.  It can take a while to empty her bladder, but feels she empties. No incontinence.  She has intermittent constipation. Having a BM daily  H/O RA, currently doing well.   Patient's last menstrual period was 12/02/2012 (approximate).          Sexually active: No.  The current method of family planning is post menopausal status.    Exercising: Yes.     Walking and yoga  Smoker:  no  Health Maintenance: Pap:   02-16-18 neg HPV HR neg             12/26/14 Neg:Neg HR HPV History of abnormal Pap:  no MMG:  03/28/21 bi-rads 1 neg  BMD:   none  Colonoscopy: 05/14/20 normal f/u 5 years  TDaP:  04/25/21 Gardasil: n/a   reports that she has never smoked. She has never been exposed to tobacco smoke. She has never used smokeless tobacco. She reports that she does not drink alcohol and does not use drugs. 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   Past Medical History:  Diagnosis Date   Allergy Early 2000s   Zithromax   Foot pain, bilateral    Hypercholesteremia    Hypertension    Prediabetes    Rheumatoid arthritis ([place])     Past Surgical History:  Procedure Laterality Date   COLONOSCOPY  03/11/2012   (2) polyps    Current Outpatient Medications  Medication Sig Dispense Refill   Alpha-Lipoic Acid 600 MG CAPS Take 600 mg by mouth 2 (two) times daily.     Ascorbic Acid (VITAMIN C PO) Take 2,000 mg by mouth 2 (two) times daily.     Barberry-Oreg Grape-Goldenseal (BERBERINE COMPLEX PO) Take by mouth.     beta carotene 25000 UNIT capsule Take 20,000 Units by mouth once a week. Vitamin A     Black Pepper-Turmeric (TURMERIC CURCUMIN) 09-998 MG CAPS Take 1,000 mg by mouth 2 (two)  times daily.     Cholecalciferol (VITAMIN D PO) Take 10,000 Int'l Units by mouth daily.     hydroxychloroquine (PLAQUENIL) 200 MG tablet TAKE ONE TABLET BY MOUTH DAILY 90 tablet 0   lactobacillus acidophilus (BACID) TABS tablet Take 2 tablets by mouth daily.     lisinopril (ZESTRIL) 5 MG tablet TAKE ONE TABLET BY MOUTH DAILY 90 tablet 1   MAGNESIUM PO Take 400 mg by mouth. 5 days per week     MILK THISTLE PO Take 600 mg by mouth daily.     minocycline (MINOCIN,DYNACIN) 50 MG capsule Take by mouth. Takes 158m Mon, Wed, Fri     MISC NATURAL PRODUCTS PO Take by mouth. Holy Basil     Multiple Vitamins-Minerals (ZINC PO) Take by mouth.     Omega-3 Fatty Acids (OMEGA-3 FISH OIL PO) Take by mouth.     Thiamine HCl (B-1 PO)      No current facility-administered medications for this visit.    Family History  Problem Relation Age of Onset   Diabetes Mother    Hypertension Mother    Depression Mother    Asthma Mother    COPD Mother  Kidney disease Mother    Diabetes Father    Multiple myeloma Father    Hypertension Father    Thyroid disease Sister    Hypertension Brother    Rheum arthritis Maternal Uncle    Rheum arthritis Maternal Grandmother    Arthritis Maternal Grandmother    Rheum arthritis Cousin     Review of Systems  All other systems reviewed and are negative.   Exam:   BP 128/84   Pulse 75   Ht _0  (1.626 m)   Wt 183 lb (83 kg)   LMP 12/02/2012 (Approximate)   SpO2 (!) 66%   BMI 31.41 kg/m   Weight change: _1 @ Height:   Height: _2  (162.6 cm)  Ht Readings from Last 3 Encounters:  03/12/22 _3  (1.626 m)  03/03/22 5' 4.5" (1.638 m)  02/20/22 5' 4.5" (1.638 m)    General appearance: alert, cooperative and appears stated age Head: Normocephalic, without obvious abnormality, atraumatic Neck: no adenopathy, supple, symmetrical, trachea midline and thyroid normal to inspection and palpation Lungs: clear to auscultation bilaterally Cardiovascular:  regular rate and rhythm Breasts: normal appearance, no masses or tenderness Abdomen: soft, non-tender; non distended,  no masses,  no organomegaly Extremities: extremities normal, atraumatic, no cyanosis or edema Skin: Skin color, texture, turgor normal. No rashes or lesions Lymph nodes: Cervical, supraclavicular, and axillary nodes normal. No abnormal inguinal nodes palpated Neurologic: Grossly normal   Pelvic: External genitalia:  no lesions              Urethra:  normal appearing urethra with no masses, tenderness or lesions              Bartholins and Skenes: normal                 Vagina: mildly appearing vagina with normal color and discharge, no lesions. She has a grade 2 uterine prolapse with valsalva, no significant cystocele or rectocele. Patient examined supine and standing, with and without valsalva.               Cervix: no lesions               Bimanual Exam:  Uterus:  normal size, contour, position, consistency, mobility, non-tender              Adnexa: no mass, fullness, tenderness               Rectovaginal: Confirms               Anus:  normal sphincter tone, no lesions  Gae Dry chaperoned for the exam.  1. Well woman exam Discussed breast self exam Discussed calcium and vit D intake Mammogram scheduled Colonoscopy UTD Labs with primary  2. Uterine prolapse Starting to bother her some. No change in her exam from last year. Recommended she avoid straining and heavy lifting.  Recommended she try a vaginal moisturizer to see if that helps her symptoms. -Discussed options of: do nothing, pessary and surgery. She is not interested in a pessary. Considering surgery, I think she would be a good candidate for a TVH and uterosacral ligament suspension. Also discussed robotic sacrocolpopexy. -She will let me know if she wants to do anything else.

## 2022-03-11 ENCOUNTER — Ambulatory Visit: Payer: Commercial Managed Care - PPO | Admitting: Obstetrics and Gynecology

## 2022-03-12 ENCOUNTER — Encounter: Payer: Self-pay | Admitting: Obstetrics and Gynecology

## 2022-03-12 ENCOUNTER — Ambulatory Visit (INDEPENDENT_AMBULATORY_CARE_PROVIDER_SITE_OTHER): Payer: Commercial Managed Care - PPO | Admitting: Obstetrics and Gynecology

## 2022-03-12 VITALS — BP 128/84 | HR 75 | Ht 64.0 in | Wt 183.0 lb

## 2022-03-12 DIAGNOSIS — Z01419 Encounter for gynecological examination (general) (routine) without abnormal findings: Secondary | ICD-10-CM | POA: Diagnosis not present

## 2022-03-12 DIAGNOSIS — N814 Uterovaginal prolapse, unspecified: Secondary | ICD-10-CM

## 2022-03-12 NOTE — Patient Instructions (Signed)

## 2022-05-06 ENCOUNTER — Other Ambulatory Visit: Payer: Self-pay | Admitting: Nurse Practitioner

## 2022-05-06 DIAGNOSIS — I10 Essential (primary) hypertension: Secondary | ICD-10-CM

## 2022-05-06 NOTE — Telephone Encounter (Signed)
Chart supports Rx Last OV: 02/2022 Next OV: not scheduled

## 2022-05-12 ENCOUNTER — Other Ambulatory Visit: Payer: Self-pay | Admitting: Internal Medicine

## 2022-05-12 NOTE — Telephone Encounter (Signed)
Next Visit: 09/02/2022  Last Visit: 03/03/2022  Labs: 04/13/2022 CMP BUN 25  Eye exam: 01/06/2022 WNL   Current Dose per office note 03/03/2022: hydroxychloroquine 200 mg daily   JO:[id number] arthritis involving right wrist with positive rheumatoid factor   Last Fill: 02/25/2022  Okay to refill Plaquenil?

## 2022-06-26 ENCOUNTER — Telehealth: Payer: Self-pay | Admitting: Nurse Practitioner

## 2022-07-11 LAB — HM MAMMOGRAPHY

## 2022-08-13 NOTE — Telephone Encounter (Signed)
error 

## 2022-08-21 ENCOUNTER — Other Ambulatory Visit: Payer: Self-pay | Admitting: Internal Medicine

## 2022-08-21 NOTE — Telephone Encounter (Signed)
Last Fill: 05/13/2022  Eye exam: 01/06/2022 WNL  Labs: 04/13/2022 CMP: BUN 25 10/13/2021 CBC WNL  Next Visit: 09/02/2022  Last Visit: 03/03/2022  UR:[id number] arthritis involving right wrist with positive rheumatoid factor   Current Dose per office note 03/03/2022: hydroxychloroquine 200 mg daily   Patient to update labs at upcomming appointment.   Okay to refill Plaquenil?

## 2022-09-02 ENCOUNTER — Ambulatory Visit: Payer: Commercial Managed Care - PPO | Attending: Internal Medicine | Admitting: Internal Medicine

## 2022-09-02 ENCOUNTER — Encounter: Payer: Self-pay | Admitting: Internal Medicine

## 2022-09-02 VITALS — BP 152/89 | HR 81 | Resp 14 | Ht 64.5 in | Wt 184.0 lb

## 2022-09-02 DIAGNOSIS — M05731 Rheumatoid arthritis with rheumatoid factor of right wrist without organ or systems involvement: Secondary | ICD-10-CM

## 2022-09-02 DIAGNOSIS — R21 Rash and other nonspecific skin eruption: Secondary | ICD-10-CM | POA: Insufficient documentation

## 2022-09-02 MED ORDER — MINOCYCLINE HCL 50 MG PO CAPS: ORAL_CAPSULE | ORAL | 1 refills | Status: DC

## 2022-09-02 NOTE — Progress Notes (Signed)
Office Visit Note  Patient: Julie [name]             Date of Birth: 06-25-1960           MRN: [id number]             PCP: Anne Ng, NP Referring: Anne Ng, NP Visit Date: 09/02/2022   Subjective:  Follow-up (Patient would like to talk about prescribing minocycline. )   History of Present Illness: Julie [name] is a 62 y.o. 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  Overall she has been doing well without major symptom exacerbation.  She was sick with COVID since her last visit she had a lot of fatigue associated with this but uncomplicated and resolved without additional complications.  No infections requiring antibiotic treatment.  She is having to change providers who her primary care and OB/GYN.  Has been on minocycline taking 100 mg twice daily for 3 days of the week which she has taken for years with significant arthritis benefit.  Previous HPI 03/03/22 Julie [name] is a 62 y.o. 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 Symptoms are doing well overall with no significant morning stiffness. She has a small area of persistent swelling on th flexor side proximal to her right wrist but no associated pain. She estimates needing to take NSAIDs only once since last visit. She has some clicking or popping in the medial side of her left knee also not particularly painful.   Previous HPI 08/20/2021 Julie [name] is a 62 y.o. female here for follow up for seropositive RA. She was taking HCQ but felt some jittery or tremulousness symptoms so interrupting taking this medication. She was taking diclofenac 75 mg as needed for joint inflammation but stopped this entirely since October last year and has done well off treatment. Joint swelling is improved she notices a mild amount persistent on volar aspect of right wrist. Wrist pain tends to be a bit worse with extended position such as her twice weekly yoga  class. Anxiety symptoms she asked about with the plaquenil improved on their own without discontinuing the medication.   Previous HPI 02/10/21 Julie [name] is a 62 y.o. female here for follow up for seropositive rheumatoid arthritis after restarting hydroxychloroquine 400 mg PO Monday-Friday after initial visit 8 weeks ago. Baseline labs obtained at initial visit showing normal sedimentation rate and negative for hepatitis and TB screening.  Starting the medicine she noticed a few areas of improvement.  Swelling has decreased on the radial aspect of the wrist though swelling continues on the ulnar side and down to the fifth finger.  She is no longer having pain radiating up from the wrist or triggering of her fingers.  Morning stiffness is decreased.  She also feels decreased pain and stiffness in the right shoulder and in bilateral knees.  She denies any problems taking the medication she tried increasing it to 10 tablets/week but went back down to once daily due to ease.   Previous HPI 12/16/20 Julie [name] is a 62 y.o. female here for inflammatory arthritis of multiple sites with positive ANA and positive CCP Abs. She was originally diagnosed in 2014 with inflammation of right second and third fingers and left second finger.  She has had concerns with systemic immunosuppression and side effects.  She started treatment with minocycline since 2014 she felt initially her symptoms got significantly worse before she did eventually feel  some improvement.  She was also started on hydroxychloroquine that she took for about 2 years but has been off this since 2018.  Throughout this time she is continued intermittent oral NSAID use. Symptoms have been persistently in some amount of exacerbation since around March of this year. She was seen In sports medicine clinic with concern for right wrist synovitis and left knee medial meniscal injury and treatment with oral prednisone and diclofenac.  She did not notice  very much change with 20 mg prednisone for 1 week duration. She has been taking diclofenac twice daily  recently due to the increased symptoms, also with right shoulder pain. She notices some stiffness and decreased mobility of her right hand fingers but only swelling in the wrist.   Labs reviewed 03/2018 CCP >200   Imaging reviewed 09/2020 Xray right wrist No acute injury, no significant arthritis changes 09/2020 Xray left knee Mild osteoarthritis without effusion or erosions or abnormal calcifications   Review of Systems  Constitutional:  Negative for fatigue.  HENT:  Negative for mouth sores and mouth dryness.   Eyes:  Negative for dryness.  Respiratory:  Negative for shortness of breath.   Cardiovascular:  Negative for chest pain and palpitations.  Gastrointestinal:  Negative for blood in stool, constipation and diarrhea.  Endocrine: Negative for increased urination.  Genitourinary:  Negative for involuntary urination.  Musculoskeletal:  Negative for joint pain, gait problem, joint pain, joint swelling, myalgias, muscle weakness, morning stiffness, muscle tenderness and myalgias.  Skin:  Negative for color change, rash, hair loss and sensitivity to sunlight.  Allergic/Immunologic: Negative for susceptible to infections.  Neurological:  Negative for dizziness and headaches.  Hematological:  Negative for swollen glands.  Psychiatric/Behavioral:  Negative for depressed mood and sleep disturbance. The patient is not nervous/anxious.     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   Hypertension 02/03/2017   Impaired fasting glucose 02/03/2017   Synovitis of hand 12/27/2013   Multiple pigmented nevi 12/15/2013    Rheumatoid arthritis 02/23/2013    Past Medical History:  Diagnosis Date   Allergy Early 2000s   Zithromax   Foot pain, bilateral    Hypercholesteremia    Hypertension    Positive ANA (antinuclear antibody) 03/09/2013   Prediabetes    Rheumatoid arthritis     Family History  Problem Relation Age of Onset   Diabetes Mother    Hypertension Mother    Depression Mother    Asthma Mother    COPD Mother    Kidney disease Mother    Diabetes Father    Multiple myeloma Father    Hypertension Father    Thyroid disease Sister    Hypertension Brother    Rheum arthritis Maternal Uncle    Rheum arthritis Maternal Grandmother    Arthritis Maternal Grandmother    Rheum arthritis Cousin    Past Surgical History:  Procedure Laterality Date   COLONOSCOPY  03/11/2012   (2) polyps   Social History   Social History Narrative   Not on file   Immunization History  Administered Date(s) Administered   DTaP 11/25/2010   Influenza Inj Mdck Quad Pf 02/14/2019   Influenza, High Dose Seasonal PF 04/24/2011, 03/19/2012   Influenza, Seasonal,  Injecte, Preservative Fre 03/30/2013   Influenza,inj,Quad PF,6+ Mos 03/30/2013, 02/20/2022   Influenza-Unspecified 03/05/2021   PFIZER(Purple Top)SARS-COV-2 Vaccination 08/18/2019, 09/15/2019, 04/08/2020, 09/02/2020   Tdap 04/25/2021     Objective: Vital Signs: BP (!) 152/89 (BP Location: Left Arm, Patient Position: Sitting, Cuff Size: Normal)   Pulse 81   Resp 14   Ht 5' 4.5" (1.638 m)   Wt 184 lb (83.5 kg)   LMP 12/02/2012 (Approximate)   BMI 31.10 kg/m    Physical Exam Constitutional:      Appearance: She is obese.  Eyes:     Conjunctiva/sclera: Conjunctivae normal.  Cardiovascular:     Rate and Rhythm: Normal rate and regular rhythm.  Pulmonary:     Effort: Pulmonary effort is normal.     Breath sounds: Normal breath sounds.  Lymphadenopathy:     Cervical: No cervical adenopathy.  Skin:    General: Skin is warm and dry.      Findings: Rash present.     Comments: Small papular rash on back of right thumb at 1st MCP joint  Neurological:     Mental Status: She is alert.  Psychiatric:        Mood and Affect: Mood normal.      Musculoskeletal Exam:  Shoulders full ROM no tenderness or swelling Elbows full ROM no tenderness or swelling Wrists full ROM no tenderness or swelling Fingers full ROM no tenderness or swelling Knees full ROM no tenderness or swelling Ankles full ROM no tenderness or swelling   CDAI Exam: CDAI Score: 0  Patient Global: 0 mm; Provider Global: 0 mm Swollen: 0 ; Tender: 0  Joint Exam 09/02/2022   All documented joints were normal     Investigation: No additional findings.  Imaging: No results found.  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  Not requiring regular NSAIDs or other anti-inflammatories for pain control.  Plan to continue hydroxychloroquine 200 mg daily agree with continuing minocycline 100 mg twice daily on 3 days weekly.  Reports preference for 50 mg capsules for pharmacy access reason.  Reviewed risks of medications she had normal ophthalmology exam in August discussed UV protection on long-term tetracycline antibiotic use.  Rash and other nonspecific skin eruption  Small rash looks consistent with contact dermatitis. Itchy but not bothering her excessively and no other extensive rashes.  Orders: No orders of the defined types were placed in this encounter.  Meds ordered  this encounter  Medications   minocycline (MINOCIN) 50 MG capsule    Sig: Takes 100mg  Mon, Wed, Fri    Dispense:  144 capsule    Refill:  1     Follow-Up Instructions: Return in about 6 months (around 03/04/2023) for RA on HCQ/minocycline f/u 6mos.   Fuller Plan, MD  Note - This record has been created using [employer].  Chart creation errors have been sought, but may not always  have been located. Such creation errors do not reflect on  the standard of medical care.

## 2022-10-27 ENCOUNTER — Encounter: Payer: Self-pay | Admitting: Nurse Practitioner

## 2022-10-28 ENCOUNTER — Encounter: Payer: Self-pay | Admitting: Nurse Practitioner

## 2022-10-28 ENCOUNTER — Ambulatory Visit: Payer: Commercial Managed Care - PPO | Admitting: Nurse Practitioner

## 2022-10-28 VITALS — BP 108/75 | HR 84 | Temp 98.4°F | Ht 64.5 in | Wt 186.0 lb

## 2022-10-28 DIAGNOSIS — I10 Essential (primary) hypertension: Secondary | ICD-10-CM | POA: Diagnosis not present

## 2022-10-28 MED ORDER — LISINOPRIL 5 MG PO TABS: 5.0000 mg | ORAL_TABLET | Freq: Every day | ORAL | 3 refills | Status: DC

## 2022-10-28 NOTE — Patient Instructions (Addendum)
Monitor BP with another machine Send BP readings via mychart on Monday Maintain current med dose Schedule appointment with GYN

## 2022-10-28 NOTE — Assessment & Plan Note (Signed)
Normal THYROID, T3 and T4 10/2021 asymptomatic

## 2022-10-28 NOTE — Progress Notes (Signed)
Established Patient Visit  Patient: Julie [name]   DOB: [birth year]   62 y.o. Female  MRN: [id number] Visit Date: 10/28/2022  Subjective:    Chief Complaint  Patient presents with   Medication Refill   HPI Hypertension Elevated BP today Reports compliance with med dose and DASh diet Home BP reading: average in June 130/80. May 120/70, April-January 110/70. BP Readings from Last 3 Encounters:  10/28/22 108/75  09/02/22 (!) 152/89  03/12/22 128/84    Maintain med dose  Use different BP cuff and send BP readings on Monday. Med refill sent  Subclinical iodine-deficiency hypothyroidism Normal THYROID, T3 and T4 10/2021 asymptomatic  BP Readings from Last 3 Encounters:  10/28/22 108/75  09/02/22 (!) 152/89  03/12/22 128/84    Reviewed medical, surgical, and social history today  Medications: Outpatient Medications Prior to Visit  Medication Sig   Alpha-Lipoic Acid 600 MG CAPS Take 600 mg by mouth 2 (two) times daily.   Ascorbic Acid (VITAMIN C PO) Take 2,000 mg by mouth 2 (two) times daily.   Barberry-Oreg Grape-Goldenseal (BERBERINE COMPLEX PO) Take by mouth.   beta carotene 56213 UNIT capsule Take 20,000 Units by mouth once a week. Vitamin A   Black Pepper-Turmeric (TURMERIC CURCUMIN) 09-998 MG CAPS Take 1,000 mg by mouth 2 (two) times daily.   Cholecalciferol (VITAMIN D PO) Take 10,000 Int'l Units by mouth daily.   Cyanocobalamin (VITAMIN B 12 PO) Take by mouth.   hydroxychloroquine (PLAQUENIL) 200 MG tablet TAKE 1 TABLET BY MOUTH DAILY   lactobacillus acidophilus (BACID) TABS tablet Take 2 tablets by mouth daily.   MAGNESIUM PO Take 400 mg by mouth. 5 days per week   Melatonin 5 MG CAPS Take by mouth.   MILK THISTLE PO Take 600 mg by mouth daily.   minocycline (MINOCIN) 50 MG capsule Takes 100mg  Mon, Wed, Fri   MISC NATURAL PRODUCTS PO Take by mouth. Holy Basil   Multiple Vitamins-Minerals (ZINC PO) Take by mouth.   Omega-3 Fatty Acids (OMEGA-3 FISH  OIL PO) Take by mouth.   [DISCONTINUED] lisinopril (ZESTRIL) 5 MG tablet TAKE 1 TABLET BY MOUTH DAILY   [DISCONTINUED] Thiamine HCl (B-1 PO)  (Patient not taking: Reported on 09/02/2022)   No facility-administered medications prior to visit.   Reviewed past medical and social history.   ROS per HPI above      Objective:  BP 108/75 Comment: home BP reading  Pulse 84   Temp 98.4 F (36.9 C) (Oral)   Ht 5' 4.5" (1.638 m)   Wt 186 lb (84.4 kg)   LMP 12/02/2012 (Approximate)   SpO2 99%   BMI 31.43 kg/m      Physical Exam Cardiovascular:     Rate and Rhythm: Normal rate.     Pulses: Normal pulses.  Pulmonary:     Effort: Pulmonary effort is normal.  Musculoskeletal:     Right lower leg: No edema.     Left lower leg: No edema.  Neurological:     Mental Status: She is alert and oriented to person, place, and time.     No results found for any visits on 10/28/22.    Assessment & Plan:    Problem List Items Addressed This Visit       Cardiovascular and Mediastinum   Hypertension - Primary    Elevated BP today Reports compliance with med dose and DASh diet Home BP reading: average in  June 130/80. May 120/70, April-January 110/70. BP Readings from Last 3 Encounters:  10/28/22 108/75  09/02/22 (!) 152/89  03/12/22 128/84    Maintain med dose  Use different BP cuff and send BP readings on Monday. Med refill sent      Relevant Medications   lisinopril (ZESTRIL) 5 MG tablet   Return in about 4 months (around 02/27/2023) for CPE (fasting).     Alysia Penna, NP

## 2022-10-28 NOTE — Assessment & Plan Note (Addendum)
Elevated BP today Reports compliance with med dose and DASh diet Home BP reading: average in June 130/80. May 120/70, April-January 110/70. BP Readings from Last 3 Encounters:  10/28/22 108/75  09/02/22 (!) 152/89  03/12/22 128/84    Maintain med dose  Use different BP cuff and send BP readings on Monday. Med refill sent

## 2022-11-03 ENCOUNTER — Encounter: Payer: Self-pay | Admitting: Nurse Practitioner

## 2022-11-04 ENCOUNTER — Other Ambulatory Visit: Payer: Self-pay | Admitting: Nurse Practitioner

## 2022-11-04 DIAGNOSIS — I10 Essential (primary) hypertension: Secondary | ICD-10-CM

## 2022-11-10 ENCOUNTER — Other Ambulatory Visit: Payer: Self-pay | Admitting: *Deleted

## 2022-11-10 ENCOUNTER — Other Ambulatory Visit: Payer: Self-pay | Admitting: Internal Medicine

## 2022-11-10 ENCOUNTER — Telehealth: Payer: Self-pay | Admitting: *Deleted

## 2022-11-10 DIAGNOSIS — Z79899 Other long term (current) drug therapy: Secondary | ICD-10-CM

## 2022-11-10 NOTE — Telephone Encounter (Signed)
Last Fill: 08/21/2022  Eye exam: 01/06/2022 WNL   Labs: 04/13/2022 BUN 25  Next Visit: 03/02/2023  Last Visit: 09/02/2022  [id number] arthritis involving right wrist with positive rheumatoid factor   Current Dose per office note 09/02/2022: hydroxychloroquine 200 mg daily   Left message to advise patient she is due to update labs.   Okay to refill Plaquenil?

## 2022-11-10 NOTE — Telephone Encounter (Signed)
Patient returned call to the office regarding message about needing lab work. Patient states she was in the office in April and states that she was not advised she needed any lab work. Patient states "I don't know what they would be checking for". I advised patient the office protocol was to have labs every 5 months while on PLQ. Patient is requesting for Dr. Dimple Casey to advise whether or not she needs labs. Please advise.

## 2022-12-17 ENCOUNTER — Encounter: Payer: Self-pay | Admitting: Internal Medicine

## 2022-12-17 DIAGNOSIS — M05731 Rheumatoid arthritis with rheumatoid factor of right wrist without organ or systems involvement: Secondary | ICD-10-CM

## 2022-12-18 MED ORDER — MINOCYCLINE HCL 50 MG PO CAPS
ORAL_CAPSULE | ORAL | 1 refills | Status: DC
Start: 2022-12-18 — End: 2023-03-02

## 2022-12-18 NOTE — Telephone Encounter (Signed)
I think we are fine to just check at our next clinic follow up in a few months.

## 2022-12-24 ENCOUNTER — Encounter: Payer: Self-pay | Admitting: Internal Medicine

## 2022-12-25 ENCOUNTER — Other Ambulatory Visit: Payer: Self-pay | Admitting: Internal Medicine

## 2022-12-25 ENCOUNTER — Other Ambulatory Visit: Payer: Self-pay | Admitting: *Deleted

## 2022-12-25 DIAGNOSIS — Z79899 Other long term (current) drug therapy: Secondary | ICD-10-CM

## 2022-12-26 LAB — COMPLETE METABOLIC PANEL WITH GFR
AG Ratio: 1.7 (calc) (ref 1.0–2.5)
ALT: 11 U/L (ref 6–29)
AST: 13 U/L (ref 10–35)
Albumin: 4.4 g/dL (ref 3.6–5.1)
Alkaline phosphatase (APISO): 67 U/L (ref 37–153)
BUN: 24 mg/dL (ref 7–25)
CO2: 27 mmol/L (ref 20–32)
Calcium: 9.3 mg/dL (ref 8.6–10.4)
Chloride: 103 mmol/L (ref 98–110)
Creat: 0.71 mg/dL (ref 0.50–1.05)
Globulin: 2.6 g/dL (ref 1.9–3.7)
Glucose, Bld: 102 mg/dL — ABNORMAL HIGH (ref 65–99)
Potassium: 4.6 mmol/L (ref 3.5–5.3)
Sodium: 140 mmol/L (ref 135–146)
Total Bilirubin: 0.3 mg/dL (ref 0.2–1.2)
Total Protein: 7 g/dL (ref 6.1–8.1)
eGFR: 95 mL/min/{1.73_m2} (ref >=60)

## 2022-12-26 LAB — CBC WITH DIFFERENTIAL/PLATELET
Absolute Monocytes: 249 {cells}/uL (ref 200–950)
Basophils Absolute: 19 {cells}/uL (ref 0–200)
Basophils Relative: 0.4 %
Eosinophils Absolute: 108 cells/uL (ref 15–500)
Eosinophils Relative: 2.3 %
HCT: 46.4 % — ABNORMAL HIGH (ref 35.0–45.0)
Hemoglobin: 15 g/dL (ref 11.7–15.5)
Lymphs Abs: 1523 {cells}/uL (ref 850–3900)
MCH: 28.2 pg (ref 27.0–33.0)
MCHC: 32.3 g/dL (ref 32.0–36.0)
MCV: 87.4 fL (ref 80.0–100.0)
MPV: 10.5 fL (ref 7.5–12.5)
Monocytes Relative: 5.3 %
Neutro Abs: 2801 {cells}/uL (ref 1500–7800)
Neutrophils Relative %: 59.6 %
Platelets: 252 10*3/uL (ref 140–400)
RBC: 5.31 10*6/uL — ABNORMAL HIGH (ref 3.80–5.10)
RDW: 13.3 % (ref 11.0–15.0)
Total Lymphocyte: 32.4 %
WBC: 4.7 10*3/uL (ref 3.8–10.8)

## 2022-12-29 ENCOUNTER — Other Ambulatory Visit: Payer: Self-pay | Admitting: Internal Medicine

## 2022-12-29 MED ORDER — HYDROXYCHLOROQUINE SULFATE 200 MG PO TABS: 200.0000 mg | ORAL_TABLET | Freq: Every day | ORAL | 0 refills | Status: DC

## 2022-12-29 NOTE — Telephone Encounter (Signed)
Last Fill: 11/10/2022 (30 day supply)  Eye exam: 01/06/2022 WNL   Labs: 12/25/2022 RBC 5.31, Hct 46.4, Glucose 102  Next Visit: 03/02/2023  Last Visit: 09/02/2022  [id number] arthritis involving right wrist with positive rheumatoid factor   Current Dose per office note 09/02/2022 : hydroxychloroquine 200 mg daily   Okay to refill Plaquenil?

## 2023-02-16 NOTE — Progress Notes (Signed)
Office Visit Note  Patient: Julie [name]             Date of Birth: 1960/08/24           MRN: [id number]             PCP: Anne Ng, NP Referring: Anne Ng, NP Visit Date: 03/02/2023   Subjective:  Follow-up   History of Present Illness: Julie [name] is a 62 y.o. female here for follow up for seropositive RA on hydroxychloroquine 200 mg daily and minocycline 100 mg BID on 3 days weekly.  Overall she is doing very well with no major flareups of joint pain or swelling.  Does not experience any new rashes or side effect with medications.  She had a recent episode of gastrointestinal symptoms with diarrhea and had some associated fevers this resolved on its own within 1 week with supportive care and did not require any antibiotics.  Did not have any sick contacts.  Has not had any similar episode for at least years.  She had a brief disruption in her medication refills and came for labs in August with blood count and metabolic panel look fine.   Previous HPI 09/02/2022 Julie [name] is a 62 y.o. 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  Overall she has been doing well without major symptom exacerbation.  She was sick with COVID since her last visit she had a lot of fatigue associated with this but uncomplicated and resolved without additional complications.  No infections requiring antibiotic treatment.  She is having to change providers who her primary care and OB/GYN.  Has been on minocycline taking 100 mg twice daily for 3 days of the week which she has taken for years with significant arthritis benefit.   Previous HPI 03/03/22 Julie [name] is a 62 y.o. 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 Symptoms are doing well overall with no significant morning stiffness. She has a small area of persistent swelling on th flexor side proximal to her right wrist but no associated pain. She estimates needing  to take NSAIDs only once since last visit. She has some clicking or popping in the medial side of her left knee also not particularly painful.   Previous HPI 08/20/2021 Julie [name] is a 62 y.o. female here for follow up for seropositive RA. She was taking HCQ but felt some jittery or tremulousness symptoms so interrupting taking this medication. She was taking diclofenac 75 mg as needed for joint inflammation but stopped this entirely since October last year and has done well off treatment. Joint swelling is improved she notices a mild amount persistent on volar aspect of right wrist. Wrist pain tends to be a bit worse with extended position such as her twice weekly yoga class. Anxiety symptoms she asked about with the plaquenil improved on their own without discontinuing the medication.   Previous HPI 02/10/21 Julie [name] is a 62 y.o. female here for follow up for seropositive rheumatoid arthritis after restarting hydroxychloroquine 400 mg PO Monday-Friday after initial visit 8 weeks ago. Baseline labs obtained at initial visit showing normal sedimentation rate and negative for hepatitis and TB screening.  Starting the medicine she noticed a few areas of improvement.  Swelling has decreased on the radial aspect of the wrist though swelling continues on the ulnar side and down to the fifth finger.  She is no longer having pain radiating up from the  wrist or triggering of her fingers.  Morning stiffness is decreased.  She also feels decreased pain and stiffness in the right shoulder and in bilateral knees.  She denies any problems taking the medication she tried increasing it to 10 tablets/week but went back down to once daily due to ease.   Previous HPI 12/16/20 Julie [name] is a 62 y.o. female here for inflammatory arthritis of multiple sites with positive ANA and positive CCP Abs. She was originally diagnosed in 2014 with inflammation of right second and third fingers and left second finger.   She has had concerns with systemic immunosuppression and side effects.  She started treatment with minocycline since 2014 she felt initially her symptoms got significantly worse before she did eventually feel some improvement.  She was also started on hydroxychloroquine that she took for about 2 years but has been off this since 2018.  Throughout this time she is continued intermittent oral NSAID use. Symptoms have been persistently in some amount of exacerbation since around March of this year. She was seen In sports medicine clinic with concern for right wrist synovitis and left knee medial meniscal injury and treatment with oral prednisone and diclofenac.  She did not notice very much change with 20 mg prednisone for 1 week duration. She has been taking diclofenac twice daily  recently due to the increased symptoms, also with right shoulder pain. She notices some stiffness and decreased mobility of her right hand fingers but only swelling in the wrist.   Labs reviewed 03/2018 CCP >200   Imaging reviewed 09/2020 Xray right wrist No acute injury, no significant arthritis changes 09/2020 Xray left knee Mild osteoarthritis without effusion or erosions or abnormal calcifications   Review of Systems  Constitutional:  Negative for fatigue.  HENT:  Negative for mouth sores and mouth dryness.   Eyes:  Negative for dryness.  Respiratory:  Negative for shortness of breath.   Cardiovascular:  Negative for chest pain and palpitations.  Gastrointestinal:  Negative for blood in stool, constipation and diarrhea.  Endocrine: Negative for increased urination.  Genitourinary:  Negative for involuntary urination.  Musculoskeletal:  Negative for joint pain, gait problem, joint pain, joint swelling, myalgias, muscle weakness, morning stiffness, muscle tenderness and myalgias.  Skin:  Negative for color change, rash, hair loss and sensitivity to sunlight.  Allergic/Immunologic: Negative for susceptible to  infections.  Neurological:  Negative for dizziness and headaches.  Hematological:  Negative for swollen glands.  Psychiatric/Behavioral:  Positive for sleep disturbance. Negative for depressed mood. The patient is not nervous/anxious.     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   Acquired hypothyroidism 04/14/2017   Hyperlipidemia 02/03/2017   Hypertension 02/03/2017   Impaired fasting glucose 02/03/2017   Synovitis of hand 12/27/2013   Multiple pigmented nevi 12/15/2013   Positive ANA (antinuclear antibody) 03/09/2013   OA (osteoarthritis) 02/23/2013   Rheumatoid arthritis (HCC) 02/23/2013    Past Medical History:  Diagnosis Date   Allergy Early 2000s   Zithromax   Foot pain, bilateral    Hypercholesteremia    Hypertension    Positive ANA (antinuclear antibody) 03/09/2013   Prediabetes    Rheumatoid arthritis (HCC)     Family History  Problem Relation  Age of Onset   Diabetes Mother    Hypertension Mother    Depression Mother    Asthma Mother    COPD Mother    Kidney disease Mother    Diabetes Father    Multiple myeloma Father    Hypertension Father    Thyroid disease Sister    Hypertension Brother    Rheum arthritis Maternal Uncle    Rheum arthritis Maternal Grandmother    Arthritis Maternal Grandmother    Rheum arthritis Cousin    Past Surgical History:  Procedure Laterality Date   COLONOSCOPY  03/11/2012   (2) polyps   Social History   Social History Narrative   Not on file   Immunization History  Administered Date(s) Administered   DTaP 11/25/2010   Influenza Inj Mdck Quad Pf 02/14/2019   Influenza, High Dose Seasonal PF 04/24/2011, 03/19/2012   Influenza, Seasonal, Injecte,  Preservative Fre 03/30/2013   Influenza,inj,Quad PF,6+ Mos 03/30/2013, 02/20/2022   Influenza-Unspecified 03/05/2021   PFIZER(Purple Top)SARS-COV-2 Vaccination 08/18/2019, 09/15/2019, 04/08/2020, 09/02/2020   Tdap 04/25/2021     Objective: Vital Signs: BP (!) 143/84 (BP Location: Left Arm, Patient Position: Sitting, Cuff Size: Normal)   Pulse 85   Resp 14   Ht 5' 4.5" (1.638 m)   Wt 179 lb (81.2 kg)   LMP 12/02/2012 (Approximate)   BMI 30.25 kg/m    Physical Exam Eyes:     Conjunctiva/sclera: Conjunctivae normal.  Cardiovascular:     Rate and Rhythm: Normal rate and regular rhythm.  Pulmonary:     Effort: Pulmonary effort is normal.     Breath sounds: Normal breath sounds.  Lymphadenopathy:     Cervical: No cervical adenopathy.  Skin:    General: Skin is warm and dry.  Neurological:     Mental Status: She is alert.  Psychiatric:        Mood and Affect: Mood normal.      Musculoskeletal Exam:  Shoulders full ROM no tenderness or swelling Elbows full ROM no tenderness or swelling Wrists full ROM no tenderness or swelling Fingers full ROM no tenderness or swelling No paraspinal tenderness to palpation over upper and lower back Hip normal internal and external rotation without pain, right slightly less than left internal mobility, no tenderness to lateral hip palpation Knees full ROM no tenderness or swelling Ankles full ROM no tenderness or swelling   Investigation: No additional findings.  Imaging: No results found.  Recent Labs: Lab Results  Component Value Date   WBC 4.7 12/25/2022   HGB 15.0 12/25/2022   PLT 252 12/25/2022   NA 140 12/25/2022   K 4.6 12/25/2022   CL 103 12/25/2022   CO2 27 12/25/2022   GLUCOSE 102 (H) 12/25/2022   BUN 24 12/25/2022   CREATININE 0.71 12/25/2022   BILITOT 0.3 12/25/2022   AST 13 12/25/2022   ALT 11 12/25/2022   PROT 7.0 12/25/2022   CALCIUM 9.3 12/25/2022   QFTBGOLDPLUS NEGATIVE 12/16/2020    Speciality  Comments: PLQ Eye Exam [facility] 01/06/2022 WNL Follow-up: 12 months   Procedures:  No procedures performed Allergies: Zithromax [azithromycin]   Assessment / Plan:     Visit Diagnoses: Rheumatoid arthritis involving right wrist with positive rheumatoid factor (HCC) - Plan: hydroxychloroquine (PLAQUENIL) 200 MG tablet, minocycline (MINOCIN) 50 MG capsule  Return to the mission appears well-controlled.  Does not have any flareups no prolonged morning stiffness.  No peripheral synovitis appreciable on exam.  Plan to continue on hydroxychloroquine 200 mg  daily and minocycline 100 mg twice daily 3 days of the week.  High risk medication use - Hydroxychloroquine 200 mg daily, Minocycline 100 mg twice daily on 3 days weekly. PLQ Eye Exam [facility] 01/06/2022 WNL. Needs updated PLQ exam.  Recent blood count metabolic panel reviewed look fine no problem for current medications.  He is to continue with annual hydroxychloroquine retinal toxicity exam.  I discussed her medications do need regular but not very close monitoring okay if we get these with her scheduled clinic follow-ups in about 6 months.  Orders: No orders of the defined types were placed in this encounter.  Meds ordered this encounter  Medications   hydroxychloroquine (PLAQUENIL) 200 MG tablet    Sig: Take 1 tablet (200 mg total) by mouth daily.    Dispense:  90 tablet    Refill:  1   minocycline (MINOCIN) 50 MG capsule    Sig: Takes 100mg  twice daily on Mon, Wed, Fri    Dispense:  144 capsule    Refill:  1     Follow-Up Instructions: Return in about 6 months (around 08/31/2023) for RA on HCQ/mino f/u 6mos.   Fuller Plan, MD  Note - This record has been created using [employer].  Chart creation errors have been sought, but may not always  have been located. Such creation errors do not reflect on  the standard of medical care.

## 2023-02-24 ENCOUNTER — Encounter: Payer: Commercial Managed Care - PPO | Admitting: Nurse Practitioner

## 2023-03-02 ENCOUNTER — Ambulatory Visit: Payer: Commercial Managed Care - PPO | Attending: Internal Medicine | Admitting: Internal Medicine

## 2023-03-02 ENCOUNTER — Encounter: Payer: Self-pay | Admitting: Internal Medicine

## 2023-03-02 VITALS — BP 143/84 | HR 85 | Resp 14 | Ht 64.5 in | Wt 179.0 lb

## 2023-03-02 DIAGNOSIS — R21 Rash and other nonspecific skin eruption: Secondary | ICD-10-CM

## 2023-03-02 DIAGNOSIS — Z79899 Other long term (current) drug therapy: Secondary | ICD-10-CM

## 2023-03-02 DIAGNOSIS — M05731 Rheumatoid arthritis with rheumatoid factor of right wrist without organ or systems involvement: Secondary | ICD-10-CM | POA: Diagnosis not present

## 2023-03-02 MED ORDER — HYDROXYCHLOROQUINE SULFATE 200 MG PO TABS
200.0000 mg | ORAL_TABLET | Freq: Every day | ORAL | 1 refills | Status: DC
Start: 2023-03-02 — End: 2023-09-01

## 2023-03-02 MED ORDER — MINOCYCLINE HCL 50 MG PO CAPS
ORAL_CAPSULE | ORAL | 1 refills | Status: DC
Start: 2023-03-02 — End: 2023-09-01

## 2023-03-15 ENCOUNTER — Ambulatory Visit: Payer: Commercial Managed Care - PPO | Admitting: Obstetrics and Gynecology

## 2023-03-16 ENCOUNTER — Ambulatory Visit: Payer: Commercial Managed Care - PPO | Admitting: Obstetrics and Gynecology

## 2023-03-17 ENCOUNTER — Encounter: Payer: Self-pay | Admitting: Obstetrics and Gynecology

## 2023-03-17 ENCOUNTER — Ambulatory Visit (INDEPENDENT_AMBULATORY_CARE_PROVIDER_SITE_OTHER): Payer: Commercial Managed Care - PPO | Admitting: Obstetrics and Gynecology

## 2023-03-17 ENCOUNTER — Other Ambulatory Visit (HOSPITAL_COMMUNITY)
Admission: RE | Admit: 2023-03-17 | Discharge: 2023-03-17 | Disposition: A | Discharge status: Home / Self Care | Payer: Commercial Managed Care - PPO | Source: Ambulatory Visit | Attending: Obstetrics and Gynecology | Admitting: Obstetrics and Gynecology

## 2023-03-17 VITALS — BP 118/74 | HR 97 | Ht 63.75 in | Wt 178.0 lb

## 2023-03-17 DIAGNOSIS — D229 Melanocytic nevi, unspecified: Secondary | ICD-10-CM

## 2023-03-17 DIAGNOSIS — Z01419 Encounter for gynecological examination (general) (routine) without abnormal findings: Secondary | ICD-10-CM | POA: Insufficient documentation

## 2023-03-17 DIAGNOSIS — E2839 Other primary ovarian failure: Secondary | ICD-10-CM | POA: Diagnosis not present

## 2023-03-17 DIAGNOSIS — Z1231 Encounter for screening mammogram for malignant neoplasm of breast: Secondary | ICD-10-CM

## 2023-03-17 DIAGNOSIS — N814 Uterovaginal prolapse, unspecified: Secondary | ICD-10-CM

## 2023-03-17 NOTE — Progress Notes (Signed)
62 y.o. G0P0 Married White or Caucasian Not Hispanic or Latino female here for annual exam.  Not sexually active. No vaginal bleeding.    She has a known grade 2 uterine prolapse.  Today at stage 3 with cervix at the hymen with valsalva. She feels like her bladder is slow to completely end towards the end and it takes longer.  She denies splinting. She has intermittent constipation. Having a BM daily  H/O RA, currently doing well.   Patient's last menstrual period was 12/02/2012 (approximate).          Sexually active: No.  The current method of family planning is post menopausal status.    Exercising: Yes.    Walking and yoga  Smoker:  no  Health Maintenance: Pap:   02-16-18 neg HPV HR neg             12/26/14 Neg:Neg HR HPV History of abnormal Pap:  no MMG:  03/28/21 bi-rads 1 neg  BMD:   none -referral placed Colonoscopy: 05/14/20 normal f/u 5 years  TDaP:  04/25/21 Gardasil: n/a  Body mass index is 30.79 kg/m.  Patient's last menstrual period was 12/02/2012 (approximate).   Blood pressure 118/74, pulse 97, height 5' 3.75" (1.619 m), weight 178 lb (80.7 kg), last menstrual period 12/02/2012, SpO2 97%.     Component Value Date/Time   DIAGPAP  02/16/2018 0000    NEGATIVE FOR INTRAEPITHELIAL LESIONS OR MALIGNANCY.   ADEQPAP  02/16/2018 0000    Satisfactory for evaluation  endocervical/transformation zone component PRESENT.    GYN HISTORY:    Component Value Date/Time   DIAGPAP  02/16/2018 0000    NEGATIVE FOR INTRAEPITHELIAL LESIONS OR MALIGNANCY.   ADEQPAP  02/16/2018 0000    Satisfactory for evaluation  endocervical/transformation zone component PRESENT.    OB History  Gravida Para Term Preterm AB Living  0 0          SAB IAB Ectopic Multiple Live Births               Past Medical History:  Diagnosis Date   Allergy Early 2000s   Zithromax   Foot pain, bilateral    Hypercholesteremia    Hypertension    Positive ANA (antinuclear antibody) 03/09/2013    Prediabetes    Rheumatoid arthritis (HCC)     Past Surgical History:  Procedure Laterality Date   COLONOSCOPY  03/11/2012   (2) polyps    Current Outpatient Medications on File Prior to Visit  Medication Sig Dispense Refill   Alpha-Lipoic Acid 600 MG CAPS Take 600 mg by mouth 2 (two) times daily.     Ascorbic Acid (VITAMIN C PO) Take 2,000 mg by mouth 2 (two) times daily.     Barberry-Oreg Grape-Goldenseal (BERBERINE COMPLEX PO) Take by mouth.     beta carotene 86578 UNIT capsule Take 20,000 Units by mouth once a week. Vitamin A     Black Pepper-Turmeric (TURMERIC CURCUMIN) 09-998 MG CAPS Take 1,000 mg by mouth 2 (two) times daily.     Cholecalciferol (VITAMIN D PO) Take 10,000 Int'l Units by mouth daily.     Cyanocobalamin (VITAMIN B 12 PO) Take by mouth.     hydroxychloroquine (PLAQUENIL) 200 MG tablet Take 1 tablet (200 mg total) by mouth daily. 90 tablet 1   lactobacillus acidophilus (BACID) TABS tablet Take 1 tablet by mouth daily.     lisinopril (ZESTRIL) 5 MG tablet Take 1 tablet (5 mg total) by mouth daily. 90 tablet 3  MAGNESIUM PO Take 400 mg by mouth. 5 days per week     Melatonin 5 MG CAPS Take by mouth.     Menaquinone-7 (K2 PO) Take by mouth.     MILK THISTLE PO Take 600 mg by mouth daily.     minocycline (MINOCIN) 50 MG capsule Takes 100mg  twice daily on Mon, Wed, Fri 144 capsule 1   MISC NATURAL PRODUCTS PO Take by mouth. Holy Basil     Multiple Vitamins-Minerals (ZINC PO) Take by mouth.     Omega-3 Fatty Acids (OMEGA-3 FISH OIL PO) Take by mouth.     No current facility-administered medications on file prior to visit.    Social History   Socioeconomic History   Marital status: Married    Spouse name: Not on file   Number of children: Not on file   Years of education: Not on file   Highest education level: Bachelor's degree (e.g., BA, AB, BS)  Occupational History   Not on file  Tobacco Use   Smoking status: Never    Passive exposure: Never   Smokeless  tobacco: Never  Vaping Use   Vaping status: Never Used  Substance and Sexual Activity   Alcohol use: Never   Drug use: Never   Sexual activity: Not Currently    Partners: Male    Birth control/protection: Post-menopausal  Other Topics Concern   Not on file  Social History Narrative   Not on file   Social Determinants of Health   Financial Resource Strain: Low Risk  (10/27/2022)   Overall Financial Resource Strain (CARDIA)    Difficulty of Paying Living Expenses: Not hard at all  Food Insecurity: No Food Insecurity (10/27/2022)   Hunger Vital Sign    Worried About Running Out of Food in the Last Year: Never true    Ran Out of Food in the Last Year: Never true  Transportation Needs: No Transportation Needs (10/27/2022)   PRAPARE - Administrator, Civil Service (Medical): No    Lack of Transportation (Non-Medical): No  Physical Activity: Sufficiently Active (10/27/2022)   Exercise Vital Sign    Days of Exercise per Week: 5 days    Minutes of Exercise per Session: 40 min  Stress: No Stress Concern Present (10/27/2022)   [employer] of Occupational Health - Occupational Stress Questionnaire    Feeling of Stress : Not at all  Social Connections: Moderately Integrated (10/27/2022)   Social Connection and Isolation Panel [NHANES]    Frequency of Communication with Friends and Family: Once a week    Frequency of Social Gatherings with Friends and Family: Never    Attends Religious Services: 1 to 4 times per year    Active Member of [employer] or Organizations: Yes    Attends Banker Meetings: 1 to 4 times per year    Marital Status: Married  Catering manager Violence: Unknown (08/13/2021)   Received from [employer], [facility]   HITS    Physically Hurt: Not on file    Insult or Talk Down To: Not on file    Threaten Physical Harm: Not on file    Scream or Curse: Not on file    Family History  Problem Relation Age of Onset   Diabetes Mother     Hypertension Mother    Depression Mother    Asthma Mother    COPD Mother    Kidney disease Mother    Diabetes Father    Multiple myeloma Father  Hypertension Father    Thyroid disease Sister    Hypertension Brother    Rheum arthritis Maternal Uncle    Rheum arthritis Maternal Grandmother    Arthritis Maternal Grandmother    Rheum arthritis Cousin      Allergies  Allergen Reactions   Zithromax [Azithromycin] Hives      Patient's last menstrual period was Patient's last menstrual period was 12/02/2012 (approximate)..            Review of Systems Alls systems reviewed and are negative.     Physical Exam Constitutional:      Appearance: Normal appearance.  Genitourinary:     Vulva and urethral meatus normal.     No lesions in the vagina.     Genitourinary Comments: Stage 3 prolapse Cervix at hymen     Right Labia: No rash, lesions or skin changes.    Left Labia: No lesions, skin changes or rash.    No vaginal discharge or tenderness.     No vaginal prolapse present.    Mild vaginal atrophy present.     Right Adnexa: not tender, not palpable and no mass present.    Left Adnexa: not tender, not palpable and no mass present.    No cervical motion tenderness or discharge.     Uterus is prolapsed.     Uterus is not enlarged, tender or irregular.     Uterus is anteverted.  Breasts:    Right: Normal.     Left: Normal.  HENT:     Head: Normocephalic.  Neck:     Thyroid: No thyroid mass, thyromegaly or thyroid tenderness.  Cardiovascular:     Rate and Rhythm: Normal rate and regular rhythm.     Heart sounds: Normal heart sounds, S1 normal and S2 normal.  Pulmonary:     Effort: Pulmonary effort is normal.     Breath sounds: Normal breath sounds and air entry.  Abdominal:     General: There is no distension.     Palpations: Abdomen is soft. There is no mass.     Tenderness: There is no abdominal tenderness. There is no guarding or rebound.  Musculoskeletal:         General: Normal range of motion.     Cervical back: Full passive range of motion without pain, normal range of motion and neck supple. No tenderness.     Right lower leg: No edema.     Left lower leg: No edema.  Neurological:     Mental Status: She is alert.  Skin:    General: Skin is warm.  Psychiatric:        Mood and Affect: Mood normal.        Behavior: Behavior normal.        Thought Content: Thought content normal.  Vitals and nursing note reviewed. Exam conducted with a chaperone present.       A:         Well Woman GYN exam, uterine prolapse                             P:        Pap smear collected today Encouraged annual mammogram screening Colon cancer screening up-to-date DXA ordered today Labs and immunizations to do with PMD Discussed breast self exams Encouraged healthy lifestyle practices Encouraged Vit D and Calcium   No follow-ups on file.  Earley Favor

## 2023-03-18 ENCOUNTER — Encounter: Payer: Self-pay | Admitting: Obstetrics and Gynecology

## 2023-03-19 LAB — CYTOLOGY - PAP
Comment: NEGATIVE
Diagnosis: NEGATIVE
High risk HPV: NEGATIVE

## 2023-03-30 ENCOUNTER — Ambulatory Visit (INDEPENDENT_AMBULATORY_CARE_PROVIDER_SITE_OTHER): Payer: Commercial Managed Care - PPO | Admitting: Nurse Practitioner

## 2023-03-30 ENCOUNTER — Encounter: Payer: Self-pay | Admitting: Nurse Practitioner

## 2023-03-30 VITALS — BP 150/98 | HR 82 | Temp 98.2°F | Resp 18 | Wt 178.2 lb

## 2023-03-30 DIAGNOSIS — R7301 Impaired fasting glucose: Secondary | ICD-10-CM | POA: Diagnosis not present

## 2023-03-30 DIAGNOSIS — E02 Subclinical iodine-deficiency hypothyroidism: Secondary | ICD-10-CM

## 2023-03-30 DIAGNOSIS — Z0001 Encounter for general adult medical examination with abnormal findings: Secondary | ICD-10-CM

## 2023-03-30 DIAGNOSIS — E782 Mixed hyperlipidemia: Secondary | ICD-10-CM

## 2023-03-30 DIAGNOSIS — G4700 Insomnia, unspecified: Secondary | ICD-10-CM | POA: Insufficient documentation

## 2023-03-30 DIAGNOSIS — I10 Essential (primary) hypertension: Secondary | ICD-10-CM

## 2023-03-30 DIAGNOSIS — Z23 Encounter for immunization: Secondary | ICD-10-CM

## 2023-03-30 DIAGNOSIS — Z0000 Encounter for general adult medical examination without abnormal findings: Secondary | ICD-10-CM

## 2023-03-30 DIAGNOSIS — Z780 Asymptomatic menopausal state: Secondary | ICD-10-CM

## 2023-03-30 LAB — TSH: TSH: 2.27 u[IU]/mL (ref 0.35–5.50)

## 2023-03-30 LAB — LIPID PANEL
Cholesterol: 217 mg/dL — ABNORMAL HIGH (ref 0–200)
HDL: 39.4 mg/dL (ref >39.00)
LDL Cholesterol: 151 mg/dL — ABNORMAL HIGH (ref 0–99)
NonHDL: 177.61
Total CHOL/HDL Ratio: 6
Triglycerides: 132 mg/dL (ref 0.0–149.0)
VLDL: 26.4 mg/dL (ref 0.0–40.0)

## 2023-03-30 LAB — HEMOGLOBIN A1C: Hgb A1c MFr Bld: 5.9 % (ref 4.6–6.5)

## 2023-03-30 LAB — T4, FREE: Free T4: 0.84 ng/dL (ref 0.60–1.60)

## 2023-03-30 NOTE — Assessment & Plan Note (Signed)
Repeat TSH and T4 asymptomatic

## 2023-03-30 NOTE — Assessment & Plan Note (Signed)
Repeat lipid panel She declined use of any statin drug

## 2023-03-30 NOTE — Progress Notes (Signed)
Complete physical exam  Patient: Julie [name]   DOB: 11/17/1960   62 y.o. Female  MRN: [id number] Visit Date: 03/30/2023  Subjective:    Chief Complaint  Patient presents with   Annual Exam    PT is here for annual exam. PT is due for vaccinations    Julie [name] is a 62 y.o. female who presents today for a complete physical exam. She reports consuming a low fat and low sodium diet.  Walking, yoga, and weight training 3-4x/week  She generally feels well. She reports sleeping well. She does not have additional problems to discuss today.  Vision:Yes Dental:Yes STD Screen:No  BP Readings from Last 3 Encounters:  03/30/23 (!) 150/98  03/17/23 118/74  03/02/23 (!) 143/84   Wt Readings from Last 3 Encounters:  03/30/23 178 lb 3.2 oz (80.8 kg)  03/17/23 178 lb (80.7 kg)  03/02/23 179 lb (81.2 kg)   Most recent fall risk assessment:    03/30/2023    9:55 AM  Fall Risk   Falls in the past year? 0  Number falls in past yr: 0  Injury with Fall? 0  Risk for fall due to : No Fall Risks  Follow up Falls evaluation completed   Depression screen:Yes - No Depression  Most recent depression screenings:    03/30/2023    9:55 AM 10/28/2022   10:01 AM  PHQ 2/9 Scores  PHQ - 2 Score 0 0  PHQ- 9 Score 1    HPI  Hypertension Home BP reading: 114/73, 123/82, 122/76, 127/74, 126/75, 117/75, 124/75, 114/72, 128/81. Reports she is compliant with lisinopril and DASH diet  BP Readings from Last 3 Encounters:  03/30/23 (!) 150/98  03/17/23 118/74  03/02/23 (!) 143/84    Maintain current med dose Advised to bring BP Machine to next appointment.   Impaired fasting glucose Repeat hgbA1c  Subclinical iodine-deficiency hypothyroidism Repeat TSH and T4 asymptomatic  Hyperlipidemia Repeat lipid panel She declined use of any statin drug  Insomnia Chronic sleep disturbance: intermittent, improved with relaxation and avoiding naps, sleep about 7-8hrs, Previous use of lunesta:  not effective Previous sleep study by [facility] (normal). possibly related to GAD?  Past Medical History:  Diagnosis Date   Allergy Early 2000s   Zithromax   Foot pain, bilateral    Hypercholesteremia    Hypertension    Positive ANA (antinuclear antibody) 03/09/2013   Prediabetes    Rheumatoid arthritis (HCC)    Past Surgical History:  Procedure Laterality Date   COLONOSCOPY  03/11/2012   (2) polyps   Social History   Socioeconomic History   Marital status: Married    Spouse name: Not on file   Number of children: Not on file   Years of education: Not on file   Highest education level: Bachelor's degree (e.g., BA, AB, BS)  Occupational History   Not on file  Tobacco Use   Smoking status: Never    Passive exposure: Never   Smokeless tobacco: Never  Vaping Use   Vaping status: Never Used  Substance and Sexual Activity   Alcohol use: Never   Drug use: Never   Sexual activity: Not Currently    Partners: Male    Birth control/protection: Post-menopausal  Other Topics Concern   Not on file  Social History Narrative   Not on file   Social Determinants of Health   Financial Resource Strain: Low Risk  (03/29/2023)   Overall Financial Resource Strain (CARDIA)    Difficulty of Paying  Living Expenses: Not hard at all  Food Insecurity: No Food Insecurity (03/29/2023)   Hunger Vital Sign    Worried About Running Out of Food in the Last Year: Never true    Ran Out of Food in the Last Year: Never true  Transportation Needs: No Transportation Needs (03/29/2023)   PRAPARE - Administrator, Civil Service (Medical): No    Lack of Transportation (Non-Medical): No  Physical Activity: Sufficiently Active (03/29/2023)   Exercise Vital Sign    Days of Exercise per Week: 5 days    Minutes of Exercise per Session: 40 min  Stress: Stress Concern Present (03/29/2023)   [employer] of Occupational Health - Occupational Stress Questionnaire    Feeling of Stress  : To some extent  Social Connections: Unknown (03/29/2023)   Social Connection and Isolation Panel [NHANES]    Frequency of Communication with Friends and Family: Patient declined    Frequency of Social Gatherings with Friends and Family: Patient declined    Attends Religious Services: More than 4 times per year    Active Member of [employer] or Organizations: Patient declined    Attends Banker Meetings: 1 to 4 times per year    Marital Status: Married  Catering manager Violence: Unknown (08/13/2021)   Received from [employer], [facility]   HITS    Physically Hurt: Not on file    Insult or Talk Down To: Not on file    Threaten Physical Harm: Not on file    Scream or Curse: Not on file   Family Status  Relation Name Status   Mother Myrene Buddy Deceased   Father Scientist, physiological Deceased   Sister  Alive   Brother Doug Alive   Mat Uncle  Alive   MGM Johnny Bridge Deceased   Cousin Maternal Cousin Alive  No partnership data on file   Family History  Problem Relation Age of Onset   Diabetes Mother    Hypertension Mother    Depression Mother    Asthma Mother    COPD Mother    Kidney disease Mother    Diabetes Father    Multiple myeloma Father    Hypertension Father    Thyroid disease Sister    Hypertension Brother    Rheum arthritis Maternal Uncle    Rheum arthritis Maternal Grandmother    Arthritis Maternal Grandmother    Rheum arthritis Cousin    Allergies  Allergen Reactions   Zithromax [Azithromycin] Hives    Patient Care Team: Kathlee Barnhardt, Bonna Gains, NP as PCP - General (Internal Medicine) Oscar La, Craig Guess, MD (Inactive) as Consulting Physician (Obstetrics and Gynecology)   Medications: Outpatient Medications Prior to Visit  Medication Sig   Alpha-Lipoic Acid 600 MG CAPS Take 600 mg by mouth 2 (two) times daily.   Ascorbic Acid (VITAMIN C PO) Take 2,000 mg by mouth 2 (two) times daily.   Barberry-Oreg Grape-Goldenseal (BERBERINE COMPLEX PO) Take by mouth.   beta  carotene 75643 UNIT capsule Take 20,000 Units by mouth once a week. Vitamin A   Black Pepper-Turmeric (TURMERIC CURCUMIN) 09-998 MG CAPS Take 1,000 mg by mouth 2 (two) times daily.   Cholecalciferol (VITAMIN D PO) Take 10,000 Int'l Units by mouth daily.   Cyanocobalamin (VITAMIN B 12 PO) Take by mouth.   hydroxychloroquine (PLAQUENIL) 200 MG tablet Take 1 tablet (200 mg total) by mouth daily.   lactobacillus acidophilus (BACID) TABS tablet Take 1 tablet by mouth daily.   lisinopril (ZESTRIL) 5 MG tablet Take  1 tablet (5 mg total) by mouth daily.   MAGNESIUM PO Take 400 mg by mouth. 5 days per week   Melatonin 5 MG CAPS Take by mouth.   Menaquinone-7 (K2 PO) Take by mouth.   MILK THISTLE PO Take 600 mg by mouth daily.   minocycline (MINOCIN) 50 MG capsule Takes 100mg  twice daily on Mon, Wed, Fri   MISC NATURAL PRODUCTS PO Take by mouth. Holy Basil   Multiple Vitamins-Minerals (ZINC PO) Take by mouth.   Omega-3 Fatty Acids (OMEGA-3 FISH OIL PO) Take by mouth.   No facility-administered medications prior to visit.    Review of Systems  Constitutional:  Negative for activity change, appetite change and unexpected weight change.  Respiratory: Negative.    Cardiovascular: Negative.   Gastrointestinal: Negative.   Endocrine: Negative for cold intolerance and heat intolerance.  Genitourinary: Negative.   Musculoskeletal: Negative.   Skin: Negative.   Neurological: Negative.   Hematological: Negative.   Psychiatric/Behavioral:  Negative for behavioral problems, decreased concentration, dysphoric mood, hallucinations, self-injury, sleep disturbance and suicidal ideas. The patient is not nervous/anxious.         Objective:  BP (!) 150/98 (BP Location: Right Arm, Patient Position: Sitting, Cuff Size: Normal) Comment: done manual recheck bp  Pulse 82   Temp 98.2 F (36.8 C) (Temporal)   Resp 18   Wt 178 lb 3.2 oz (80.8 kg)   LMP 12/02/2012 (Approximate)   SpO2 98%   BMI 30.83 kg/m      Physical Exam Vitals and nursing note reviewed.  Constitutional:      General: She is not in acute distress. HENT:     Right Ear: Tympanic membrane, ear canal and external ear normal.     Left Ear: Tympanic membrane, ear canal and external ear normal.     Nose: Nose normal.  Eyes:     Extraocular Movements: Extraocular movements intact.     Conjunctiva/sclera: Conjunctivae normal.     Pupils: Pupils are equal, round, and reactive to light.  Neck:     Thyroid: No thyroid mass, thyromegaly or thyroid tenderness.  Cardiovascular:     Rate and Rhythm: Normal rate and regular rhythm.     Pulses: Normal pulses.     Heart sounds: Normal heart sounds.  Pulmonary:     Effort: Pulmonary effort is normal.     Breath sounds: Normal breath sounds.  Abdominal:     General: Bowel sounds are normal.     Palpations: Abdomen is soft.  Musculoskeletal:        General: Normal range of motion.     Cervical back: Normal range of motion and neck supple.     Right lower leg: No edema.     Left lower leg: No edema.  Lymphadenopathy:     Cervical: No cervical adenopathy.  Skin:    General: Skin is warm and dry.  Neurological:     Mental Status: She is alert and oriented to person, place, and time.     Cranial Nerves: No cranial nerve deficit.  Psychiatric:        Mood and Affect: Mood normal.        Behavior: Behavior normal.        Thought Content: Thought content normal.      No results found for any visits on 03/30/23.    Assessment & Plan:    Routine Health Maintenance and Physical Exam  Immunization History  Administered Date(s) Administered   DTaP 11/25/2010  Fluad Trivalent(High Dose 65+) 03/30/2023   Influenza Inj Mdck Quad Pf 02/14/2019   Influenza, High Dose Seasonal PF 04/24/2011, 03/19/2012   Influenza, Seasonal, Injecte, Preservative Fre 03/30/2013   Influenza,inj,Quad PF,6+ Mos 03/30/2013, 02/20/2022   Influenza-Unspecified 03/05/2021   PFIZER(Purple Top)SARS-COV-2  Vaccination 08/18/2019, 09/15/2019, 04/08/2020, 09/02/2020   Tdap 04/25/2021   Zoster, Unspecified 03/17/2022, 06/17/2022    Health Maintenance  Topic Date Due   Zoster Vaccines- Shingrix (1 of 2) 06/10/1977   MAMMOGRAM  03/29/2023   COVID-19 Vaccine (5 - 2023-24 season) 08/03/2023 (Originally 01/10/2023)   HIV Screening  03/29/2024 (Originally 06/10/1973)   Cervical Cancer Screening (HPV/Pap Cotest)  03/16/2028   Colonoscopy  05/14/2030   DTaP/Tdap/Td (3 - Td or Tdap) 04/26/2031   INFLUENZA VACCINE  Completed   Hepatitis C Screening  Completed   HPV VACCINES  Aged Out    Discussed health benefits of physical activity, and encouraged her to engage in regular exercise appropriate for her age and condition.  Problem List Items Addressed This Visit     Hyperlipidemia    Repeat lipid panel She declined use of any statin drug      Relevant Orders   Lipid panel   Hypertension    Home BP reading: 114/73, 123/82, 122/76, 127/74, 126/75, 117/75, 124/75, 114/72, 128/81. Reports she is compliant with lisinopril and DASH diet  BP Readings from Last 3 Encounters:  03/30/23 (!) 150/98  03/17/23 118/74  03/02/23 (!) 143/84    Maintain current med dose Advised to bring BP Machine to next appointment.       Impaired fasting glucose    Repeat hgbA1c      Relevant Orders   Hemoglobin A1c   Subclinical iodine-deficiency hypothyroidism    Repeat TSH and T4 asymptomatic      Relevant Orders   T4, free   TSH   Other Visit Diagnoses     Encounter for preventative adult health care exam with abnormal findings    -  Primary   Immunization due       Relevant Orders   Flu Vaccine Trivalent High Dose (Fluad) (Completed)   Asymptomatic postmenopausal estrogen deficiency       Relevant Orders   DG Bone Density      Return in about 6 months (around 09/27/2023) for HTN, hyperlipidemia (fasting).     Alysia Penna, NP

## 2023-03-30 NOTE — Patient Instructions (Signed)
Go to lab Continue Heart healthy diet and daily exercise. Maintain current medications. Send Shingrix vaccine dates

## 2023-03-30 NOTE — Assessment & Plan Note (Addendum)
Home BP reading: 114/73, 123/82, 122/76, 127/74, 126/75, 117/75, 124/75, 114/72, 128/81. Reports she is compliant with lisinopril and DASH diet  BP Readings from Last 3 Encounters:  03/30/23 (!) 150/98  03/17/23 118/74  03/02/23 (!) 143/84    Maintain current med dose Advised to bring BP Machine to next appointment.

## 2023-03-30 NOTE — Assessment & Plan Note (Signed)
Chronic sleep disturbance: intermittent, improved with relaxation and avoiding naps, sleep about 7-8hrs, Previous use of lunesta: not effective Previous sleep study by [facility] (normal). possibly related to GAD?

## 2023-03-30 NOTE — Assessment & Plan Note (Signed)
Repeat hgbA1c 

## 2023-04-01 ENCOUNTER — Encounter: Payer: Self-pay | Admitting: Nurse Practitioner

## 2023-04-21 ENCOUNTER — Encounter: Payer: Self-pay | Admitting: Obstetrics

## 2023-04-21 ENCOUNTER — Ambulatory Visit (INDEPENDENT_AMBULATORY_CARE_PROVIDER_SITE_OTHER): Payer: Commercial Managed Care - PPO | Admitting: Obstetrics

## 2023-04-21 VITALS — BP 162/90 | Ht 64.25 in | Wt 180.4 lb

## 2023-04-21 DIAGNOSIS — N8110 Cystocele, unspecified: Secondary | ICD-10-CM | POA: Diagnosis not present

## 2023-04-21 DIAGNOSIS — N952 Postmenopausal atrophic vaginitis: Secondary | ICD-10-CM | POA: Diagnosis not present

## 2023-04-21 DIAGNOSIS — R351 Nocturia: Secondary | ICD-10-CM | POA: Insufficient documentation

## 2023-04-21 LAB — POCT URINALYSIS DIP (CLINITEK)
Bilirubin, UA: NEGATIVE
Blood, UA: NEGATIVE
Glucose, UA: NEGATIVE mg/dL
Ketones, POC UA: NEGATIVE mg/dL
Nitrite, UA: NEGATIVE
POC PROTEIN,UA: NEGATIVE
Spec Grav, UA: 1.015 (ref 1.010–1.025)
Urobilinogen, UA: 0.2 U/dL
pH, UA: 7 (ref 5.0–8.0)

## 2023-04-21 MED ORDER — ESTRADIOL 0.1 MG/GM VA CREA: 1.0000 g | TOPICAL_CREAM | VAGINAL | 3 refills | Status: AC

## 2023-04-21 NOTE — Progress Notes (Signed)
New Patient Evaluation and Consultation  Referring Provider: Earley Favor, MD PCP: Anne Ng, NP Date of Service: 04/21/2023  SUBJECTIVE Chief Complaint: referral from Dr Karma Greaser -Grade 2 Uterine prolapse (Sometimes symptomatic - does exercise yoga, some light weights, and walks/Does get up 1-3 times in the night; does dribble after urinating)  History of Present Illness: Julie [name] is a 62 y.o. White or Caucasian female seen in consultation at the request of Dr Karma Greaser for evaluation of pelvic organ prolapse.    Intermittent sensation of vaginal bulge to the vaginal opening noted at the time of showering or after bowel movement  Bulge first noted 2 years ago at the time of pelvic exam, repeat exam noted stage III pelvic organ prolapse by Dr. Karma Greaser on 03/17/23 Pt worries that symptoms to worsen as she ages Reports attempts to perform Kegel exercises  Review of records significant for: Rheumatoid arthritis on hydroxychloroquine and minocycline, Pre-DM  Urinary Symptoms: Does not leak urine.   Day time voids 4-5.  Nocturia: 1-3 times per night to void for years, affected by difficulty sleeping and voids when she is awake at night Stops drinking fluids around 6:30, sweet tea at bedtime Takes fiber at 8pm Voiding dysfunction:  empties bladder well.  Patient does not use a catheter to empty bladder.  When urinating, patient feels dribbling after finishing when she stands after voids or shifting positions to void Drinks: <64oz water per day, 2 cup of decaf tea  UTIs:  0  UTI's in the last year.   Denies history of blood in urine, kidney or bladder stones, pyelonephritis, bladder cancer, and kidney cancer No results found for the last 90 days.   Pelvic Organ Prolapse Symptoms:                  Patient Denies a feeling of a bulge the vaginal area daily. It has been present for 2-3 weeks.  Patient Denies seeing a bulge, only feels it with bowel movements This  bulge is bothersome.  Bowel Symptom: Bowel movements: 1-2 time(s) per day with history of constipation on magnesium while she was using iron supplementation Stool consistency: soft  Straining: no.  Splinting: no.  Incomplete evacuation: no.  Patient Denies accidental bowel leakage / fecal incontinence Bowel regimen: fiber 3x/day Last colonoscopy Results due for repeat in 5 years [facility] Colonoscopy          Colonoscopy (Every 10 Years) Next due on 05/14/2030    05/14/2020  [facility] Colonoscopy component of [facility] COLONOSCOPY   Only the first 1 history entries have been loaded, but more history exists.            Sexual Function Sexually active: no.  Sexual orientation: Straight Pain with sex: No, reports paper cut sensation after menopause  Pelvic Pain Denies pelvic pain   Past Medical History:  Past Medical History:  Diagnosis Date   Allergy Early 2000s   Zithromax   Foot pain, bilateral    Hypercholesteremia    Hypertension    Positive ANA (antinuclear antibody) 03/09/2013   Prediabetes    Rheumatoid arthritis (HCC)      Past Surgical History:   Past Surgical History:  Procedure Laterality Date   COLONOSCOPY  03/11/2012   (2) polyps     Past OB/GYN History: OB History  Gravida Para Term Preterm AB Living  0 0          SAB IAB Ectopic Multiple Live Births  Menopausal: Yes, at age 35, Denies vaginal bleeding since menopause Contraception: s/p. Last pap smear.  Any history of abnormal pap smears: no.    Component Value Date/Time   DIAGPAP  03/17/2023 1548    - Negative for intraepithelial lesion or malignancy (NILM)   DIAGPAP  02/16/2018 0000    NEGATIVE FOR INTRAEPITHELIAL LESIONS OR MALIGNANCY.   HPVHIGH Negative 03/17/2023 1548   ADEQPAP  03/17/2023 1548    Satisfactory for evaluation. The presence or absence of an   ADEQPAP  03/17/2023 1548    endocervical/transformation zone component cannot be determined because   ADEQPAP of atrophy.  03/17/2023 1548    Medications: Patient has a current medication list which includes the following prescription(s): alpha-lipoic acid, ascorbic acid, barberry-oreg grape-goldenseal, beta carotene, turmeric curcumin, vitamin d, cyanocobalamin, [START ON 04/22/2023] estradiol, hydroxychloroquine, lactobacillus acidophilus, lisinopril, magnesium, melatonin, menaquinone-7, milk thistle, minocycline, misc natural products, multiple vitamins-minerals, and omega-3 fatty acids.   Allergies: Patient is allergic to zithromax [azithromycin].   Social History:  Social History   Tobacco Use   Smoking status: Never    Passive exposure: Never   Smokeless tobacco: Never  Vaping Use   Vaping status: Never Used  Substance Use Topics   Alcohol use: Never   Drug use: Never    Relationship status: married Patient lives with her husband.   Patient is not employed. Regular exercise: Yes: walks 5x/wk, yoga 2x/wk, hand weights 5x/wk History of abuse: No  Family History:   Family History  Problem Relation Age of Onset   Diabetes Mother    Hypertension Mother    Depression Mother    Asthma Mother    COPD Mother    Kidney disease Mother    Diabetes Father    Multiple myeloma Father    Hypertension Father    Thyroid disease Sister    Hypertension Brother    Rheum arthritis Maternal Uncle    Rheum arthritis Maternal Grandmother    Arthritis Maternal Grandmother    Rheum arthritis Cousin      Review of Systems: Review of Systems  Constitutional:  Negative for fever, malaise/fatigue and weight loss.  Respiratory:  Negative for cough, shortness of breath and wheezing.   Cardiovascular:  Negative for chest pain, palpitations and leg swelling.  Gastrointestinal:  Negative for abdominal pain, blood in stool and constipation.  Genitourinary:  Positive for frequency (night time). Negative for dysuria, hematuria and urgency.  Skin:  Positive for rash (generalize for 2-3 weeks).  Neurological:   Negative for dizziness, weakness and headaches.  Endo/Heme/Allergies:  Does not bruise/bleed easily.       Hot flashes  Psychiatric/Behavioral:  Negative for depression. The patient is not nervous/anxious.      OBJECTIVE Physical Exam: Vitals:   04/21/23 0933 04/21/23 0948  BP: (!) 170/92 (!) 162/90  Weight: 180 lb 6.4 oz (81.8 kg)   Height: 5' 4.25" (1.632 m)     Physical Exam Constitutional:      General: She is not in acute distress.    Appearance: Normal appearance.  Genitourinary:     Bladder and urethral meatus normal.     No lesions in the vagina.     Right Labia: No rash, tenderness, lesions, skin changes or Bartholin's cyst.    Left Labia: No tenderness, lesions, skin changes, Bartholin's cyst or rash.    No vaginal discharge, erythema, tenderness, bleeding, ulceration or granulation tissue.     Anterior, posterior and apical vaginal prolapse present.    Moderate  vaginal atrophy present.     Right Adnexa: not tender, not full and no mass present.    Left Adnexa: not tender, not full and no mass present.    No cervical motion tenderness, discharge, friability, lesion, polyp or nabothian cyst.     Uterus is prolapsed.     Uterus is not enlarged, fixed, tender or irregular.     No uterine mass detected.    Urethral meatus caruncle not present.    No urethral prolapse, tenderness, mass, hypermobility, discharge or stress urinary incontinence with cough stress test present.     Bladder is not tender, urgency on palpation not present and masses not present.      Pelvic Floor: Levator muscle strength is 4/5.    Levator ani not tender, obturator internus not tender, no asymmetrical contractions present and no pelvic spasms present.    Symmetrical pelvic sensation, anal wink present and BC reflex present. Cardiovascular:     Rate and Rhythm: Normal rate.  Pulmonary:     Effort: Pulmonary effort is normal. No respiratory distress.  Abdominal:     General: There is no  distension.     Palpations: Abdomen is soft. There is no mass.     Tenderness: There is no abdominal tenderness.     Hernia: No hernia is present.  Neurological:     Mental Status: She is alert.  Skin:    Findings: Rash (generalized red circular macular rash over neck, arms, trunk and legs. Non-prurtic, painless) present.  Vitals reviewed. Exam conducted with a chaperone present.      POP-Q:   POP-Q  -1                                            Aa   -1                                           Ba  1                                              C   2                                            Gh  5                                            Pb  7                                            tvl   -1  Ap  -1                                            Bp  0                                              D     Post-Void Residual (PVR) by Bladder Scan: In order to evaluate bladder emptying, we discussed obtaining a postvoid residual and patient agreed to this procedure.  Procedure: The ultrasound unit was placed on the patient's abdomen in the suprapubic region after the patient had voided.    Post Void Residual - 04/21/23 0900       Post Void Residual   Post Void Residual 40 mL              Laboratory Results: Lab Results  Component Value Date   COLORU yellow 04/21/2023   CLARITYU clear 04/21/2023   GLUCOSEUR negative 04/21/2023   BILIRUBINUR negative 04/21/2023   KETONESU n 12/26/2014   SPECGRAV 1.015 04/21/2023   RBCUR negative 04/21/2023   PHUR 7.0 04/21/2023   PROTEINUR n 12/26/2014   UROBILINOGEN 0.2 04/21/2023   LEUKOCYTESUR Trace (A) 04/21/2023    Lab Results  Component Value Date   CREATININE 0.71 12/25/2022    Lab Results  Component Value Date   HGBA1C 5.9 03/30/2023    Lab Results  Component Value Date   HGB 15.0 12/25/2022     ASSESSMENT AND PLAN Ms. Keown is a 62 y.o. with:   1. Nocturia   2. Pelvic organ prolapse quantification stage 2 cystocele   3. Vaginal atrophy     Nocturia Assessment & Plan: - attributed to sleep disruption - avoid fluid intake 3 hrs before bedtime - POCT trace leuk, denies UTI symptoms  Orders: -     POCT URINALYSIS DIP (CLINITEK)  Pelvic organ prolapse quantification stage 2 cystocele Assessment & Plan: - For treatment of pelvic organ prolapse, we discussed options for management including expectant management, conservative management, and surgical management, such as Kegels, a pessary, pelvic floor physical therapy, and specific surgical procedures. - reviewed instructions for Kegel exercises, encouraged to consider pelvic floor PT and pessary if she desires to avoid surgical intervention  Orders: -     Estradiol; Place 1 g vaginally 2 (two) times a week. Place 0.5g nightly for two weeks then twice a week after  Dispense: 30 g; Refill: 3  Vaginal atrophy Assessment & Plan:  For symptomatic vaginal atrophy options include lubrication with a water-based lubricant, personal hygiene measures and barrier protection against wetness, and estrogen replacement in the form of vaginal cream, vaginal tablets, or a time-released vaginal ring.   - Rx for estrace sent to start  Orders: -     Estradiol; Place 1 g vaginally 2 (two) times a week. Place 0.5g nightly for two weeks then twice a week after  Dispense: 30 g; Refill: 3  Time spent: I spent 70 minutes dedicated to the care of this patient on the date of this encounter to include pre-visit review of records, face-to-face time with the patient discussing pelvic organ prolapse, nocturia, vaginal atrophy, and post visit documentation and ordering medication/ testing.   Loleta Chance, MD

## 2023-04-21 NOTE — Assessment & Plan Note (Signed)
For symptomatic vaginal atrophy options include lubrication with a water-based lubricant, personal hygiene measures and barrier protection against wetness, and estrogen replacement in the form of vaginal cream, vaginal tablets, or a time-released vaginal ring.   - Rx for estrace sent to start

## 2023-04-21 NOTE — Assessment & Plan Note (Addendum)
-   For treatment of pelvic organ prolapse, we discussed options for management including expectant management, conservative management, and surgical management, such as Kegels, a pessary, pelvic floor physical therapy, and specific surgical procedures. - reviewed instructions for Kegel exercises, encouraged to consider pelvic floor PT and pessary if she desires to avoid surgical intervention

## 2023-04-21 NOTE — Patient Instructions (Addendum)
You have a stage 2 (out of 4) prolapse.  We discussed the fact that it is not life threatening but there are several treatment options. For treatment of pelvic organ prolapse, we discussed options for management including expectant management, conservative management, and surgical management, such as Kegels, a pessary, pelvic floor physical therapy, and specific surgical procedures.     Women should try to eat at least 21 to 25 grams of fiber a day, while men should aim for 30 to 38 grams a day. You can add fiber to your diet with food or a fiber supplement such as psyllium (metamucil), benefiber, or fibercon.   Here's a look at how much dietary fiber is found in some common foods. When buying packaged foods, check the Nutrition Facts label for fiber content. It can vary among brands.  Fruits Serving size Total fiber (grams)*  Raspberries 1 cup 8.0  Pear 1 medium 5.5  Apple, with skin 1 medium 4.5  Banana 1 medium 3.0  Orange 1 medium 3.0  Strawberries 1 cup 3.0   Vegetables Serving size Total fiber (grams)*  Green peas, boiled 1 cup 9.0  Broccoli, boiled 1 cup chopped 5.0  Turnip greens, boiled 1 cup 5.0  Brussels sprouts, boiled 1 cup 4.0  Potato, with skin, baked 1 medium 4.0  Sweet corn, boiled 1 cup 3.5  Cauliflower, raw 1 cup chopped 2.0  Carrot, raw 1 medium 1.5   Grains Serving size Total fiber (grams)*  Spaghetti, whole-wheat, cooked 1 cup 6.0  Barley, pearled, cooked 1 cup 6.0  Bran flakes 3/4 cup 5.5  Quinoa, cooked 1 cup 5.0  Oat bran muffin 1 medium 5.0  Oatmeal, instant, cooked 1 cup 5.0  Popcorn, air-popped 3 cups 3.5  Brown rice, cooked 1 cup 3.5  Bread, whole-wheat 1 slice 2.0  Bread, rye 1 slice 2.0   Legumes, nuts and seeds Serving size Total fiber (grams)*  Split peas, boiled 1 cup 16.0  Lentils, boiled 1 cup 15.5  Black beans, boiled 1 cup 15.0  Baked beans, canned 1 cup 10.0  Chia seeds 1 ounce 10.0  Almonds 1 ounce (23 nuts) 3.5  Pistachios 1 ounce  (49 nuts) 3.0  Sunflower kernels 1 ounce 3.0  *Rounded to nearest 0.5 gram. Source: [employer] for [employer], Legacy Release    For vaginal atrophy (thinning of the vaginal tissue that can cause dryness and burning) and UTI prevention we discussed estrogen replacement in the form of vaginal cream.   Start vaginal estrogen therapy nightly for two weeks then 2 times weekly at night. This can be placed with your finger or an applicator inside the vagina and around the urethra.  Please let us know if the prescription is too expensive and we can look for alternative options.   Is vaginal estrogen therapy safe for me? Vaginal estrogen preparations act on the vaginal skin, and only a very tiny amount is absorbed into the bloodstream (0.01%).  They work in a similar way to hand or face cream.  There is minimal absorption and they are therefore perfectly safe. If you have had breast cancer and have persistent troublesome symptoms which aren't settling with vaginal moisturisers and lubricants, local estrogen treatment may be a possibility, but consultation with your oncologist should take place first.

## 2023-04-21 NOTE — Assessment & Plan Note (Addendum)
-   attributed to sleep disruption - avoid fluid intake 3 hrs before bedtime - POCT trace leuk, denies UTI symptoms

## 2023-04-22 LAB — HM DEXA SCAN

## 2023-04-26 ENCOUNTER — Encounter: Payer: Self-pay | Admitting: Nurse Practitioner

## 2023-04-26 DIAGNOSIS — M8580 Other specified disorders of bone density and structure, unspecified site: Secondary | ICD-10-CM | POA: Insufficient documentation

## 2023-04-28 ENCOUNTER — Encounter: Payer: Self-pay | Admitting: Nurse Practitioner

## 2023-04-28 ENCOUNTER — Ambulatory Visit: Payer: Commercial Managed Care - PPO | Admitting: Nurse Practitioner

## 2023-04-28 VITALS — BP 154/88 | HR 86 | Temp 98.1°F | Resp 18 | Wt 179.6 lb

## 2023-04-28 DIAGNOSIS — L210 Seborrhea capitis: Secondary | ICD-10-CM

## 2023-04-28 LAB — CBC WITH DIFFERENTIAL/PLATELET
Basophils Absolute: 0 10*3/uL (ref 0.0–0.1)
Basophils Relative: 0.4 % (ref 0.0–3.0)
Eosinophils Absolute: 0.1 10*3/uL (ref 0.0–0.7)
Eosinophils Relative: 1.7 % (ref 0.0–5.0)
HCT: 45.8 % (ref 36.0–46.0)
Hemoglobin: 14.8 g/dL (ref 12.0–15.0)
Lymphocytes Relative: 22.3 % (ref 12.0–46.0)
Lymphs Abs: 1.4 10*3/uL (ref 0.7–4.0)
MCHC: 32.4 g/dL (ref 30.0–36.0)
MCV: 88.5 fL (ref 78.0–100.0)
Monocytes Absolute: 0.4 10*3/uL (ref 0.1–1.0)
Monocytes Relative: 5.8 % (ref 3.0–12.0)
Neutro Abs: 4.3 10*3/uL (ref 1.4–7.7)
Neutrophils Relative %: 69.8 % (ref 43.0–77.0)
Platelets: 280 10*3/uL (ref 150.0–400.0)
RBC: 5.17 Mil/uL — ABNORMAL HIGH (ref 3.87–5.11)
RDW: 14.7 % (ref 11.5–15.5)
WBC: 6.2 10*3/uL (ref 4.0–10.5)

## 2023-04-28 LAB — BASIC METABOLIC PANEL
BUN: 19 mg/dL (ref 6–23)
CO2: 27 meq/L (ref 19–32)
Calcium: 8.9 mg/dL (ref 8.4–10.5)
Chloride: 104 meq/L (ref 96–112)
Creatinine, Ser: 0.69 mg/dL (ref 0.40–1.20)
GFR: 91.42 mL/min (ref >60.00)
Glucose, Bld: 94 mg/dL (ref 70–99)
Potassium: 3.9 meq/L (ref 3.5–5.1)
Sodium: 139 meq/L (ref 135–145)

## 2023-04-28 LAB — SEDIMENTATION RATE: Sed Rate: 39 mm/h — ABNORMAL HIGH (ref 0–30)

## 2023-04-28 LAB — C-REACTIVE PROTEIN: CRP: <1 mg/dL (ref 0.5–20.0)

## 2023-04-28 NOTE — Patient Instructions (Signed)
Go to lab Start claritin or zyrtec 1tab daily Ok to use cortisone cream if itching. Will sent antiviral if normal lab results.  Pityriasis Alba Pityriasis alba is a skin condition that causes red or pink scaly patches of skin (pityriasis) that then lose color (alba). This is a temporary condition that commonly affects children between the ages of 46 and 16 years. It cannot spread from one child to another (is not contagious). The skin clears up over time, usually within a few months to 1 year. What are the causes? The cause of this condition is not known. Children with allergies may be at higher risk. What are the signs or symptoms? Symptoms of this condition are a series of skin changes. In the first phase, up to 20 small red or pink skin patches covered with fine scales develop. They may be round or oval and slightly itchy. The patches commonly affect the face and cheeks. Some children also get patches in other areas of the body, usually the arms, shoulders, neck, or trunk. The redness eventually goes away. The patches will still have a scaly surface, but the skin loses color and becomes paler than the usual skin color. Over time, the scaly surface also clears up and leaves only smooth pale or white patches. The smooth patches regain the usual skin color within a month to a year. The condition rarely lasts longer than this. How is this diagnosed? This condition is diagnosed based on: Your child's symptoms and medical history. A physical exam. How is this treated? This condition usually goes away without treatment. However, your child's health care provider may suggest: A thick lotion (emollient cream) to moisturize the skin. A mild steroid cream if your child's skin is itchy during the first phase. A moisturizing cream may be used during the scaly phase. In rare cases, other treatments may be used if the skin patches cover a large area or do not start to go away. These include: Medicated  creams. Treatments with ultraviolet light. Follow these instructions at home: Give your child over-the-counter and prescription medicines only as told by your child's health care provider. Use skin creams or lotions only as told by your child's health care provider. Ask your child's health care provider to recommend a moisturizer and a sunscreen. Dry skin and sun exposure can make this condition worse. Keep all follow-up visits. This is important. Contact a health care provider if: Your child still has signs of this condition after 1 year. The affected areas get worse or do not get better with topical medicine, including creams and ointments. Summary Pityriasis alba is a skin condition that causes red or pink scaly patches of skin (pityriasis) that then lose color (alba). This condition commonly affects children between the ages of 25 and 16 years. This condition cannot spread from one child to another (is not contagious). Your child's health care provider may recommend using a mild steroid cream or a moisturizer to relieve itchiness and dryness. Pityriasis alba almost always clears up without treatment within 1 year. This information is not intended to replace advice given to you by your health care provider. Make sure you discuss any questions you have with your health care provider. Document Revised: 02/05/2020 Document Reviewed: 02/05/2020 Elsevier Patient Education  2024 [employer].

## 2023-04-28 NOTE — Progress Notes (Signed)
Acute Office Visit  Subjective:    Patient ID: Julie [name], female    DOB: 02-Aug-1960, 62 y.o.   MRN: [id number]  Chief Complaint  Patient presents with   OFFICE VISIT     PT C/O of hives on chest and arms for 3 weeks no itchiness or burning sensation present; mammogram done at [facility] report was requested    Rash This is a new problem. The current episode started 1 to 4 weeks ago. The problem has been gradually worsening since onset. The rash is diffuse. The rash is characterized by dryness, redness and scaling. She was exposed to nothing. Pertinent negatives include no anorexia, congestion, cough, diarrhea, eye pain, facial edema, fatigue, fever, joint pain, nail changes, rhinorrhea, shortness of breath, sore throat or vomiting. Past treatments include nothing. There is no history of allergies, asthma, eczema or varicella.  Denies any recent URI symptoms or new medications. No myalgia or joint pain or stiffness or swelling or redness. Hx of hypothyroidism and RA  Outpatient Medications Prior to Visit  Medication Sig   Alpha-Lipoic Acid 600 MG CAPS Take 600 mg by mouth 2 (two) times daily.   Ascorbic Acid (VITAMIN C PO) Take 2,000 mg by mouth 2 (two) times daily.   Barberry-Oreg Grape-Goldenseal (BERBERINE COMPLEX PO) Take by mouth.   beta carotene 08657 UNIT capsule Take 20,000 Units by mouth once a week. Vitamin A   Black Pepper-Turmeric (TURMERIC CURCUMIN) 09-998 MG CAPS Take 1,000 mg by mouth 2 (two) times daily.   Cholecalciferol (VITAMIN D PO) Take 10,000 Int'l Units by mouth daily.   Cyanocobalamin (VITAMIN B 12 PO) Take by mouth.   estradiol (ESTRACE) 0.1 MG/GM vaginal cream Place 1 g vaginally 2 (two) times a week. Place 0.5g nightly for two weeks then twice a week after   hydroxychloroquine (PLAQUENIL) 200 MG tablet Take 1 tablet (200 mg total) by mouth daily.   lactobacillus acidophilus (BACID) TABS tablet Take 1 tablet by mouth daily.    lisinopril (ZESTRIL) 5 MG tablet Take 1 tablet (5 mg total) by mouth daily.   MAGNESIUM PO Take 400 mg by mouth. 5 days per week   Melatonin 5 MG CAPS Take by mouth.   Menaquinone-7 (K2 PO) Take by mouth.   MILK THISTLE PO Take 600 mg by mouth daily.   minocycline (MINOCIN) 50 MG capsule Takes 100mg  twice daily on Mon, Wed, Fri   MISC NATURAL PRODUCTS PO Take by mouth. Holy Basil   Multiple Vitamins-Minerals (ZINC PO) Take by mouth.   Omega-3 Fatty Acids (OMEGA-3 FISH OIL PO) Take by mouth.   No facility-administered medications prior to visit.   Reviewed past medical and social history.   Review of Systems  Constitutional:  Negative for fatigue and fever.  HENT:  Negative for congestion, rhinorrhea and sore throat.   Eyes:  Negative for pain.  Respiratory:  Negative for cough and shortness of breath.   Gastrointestinal:  Negative for anorexia, diarrhea and vomiting.  Musculoskeletal:  Negative for joint pain.  Skin:  Positive for rash. Negative for nail changes.   Per HPI     Objective:    Physical Exam Vitals and nursing note reviewed.  HENT:     Mouth/Throat:     Mouth: Mucous membranes are moist. No oral lesions.     Tongue: No lesions.     Palate: No lesions.     Pharynx: Oropharynx is clear. Uvula midline.  Neck:     Thyroid:  No thyroid mass, thyromegaly or thyroid tenderness.  Cardiovascular:     Rate and Rhythm: Normal rate.     Pulses: Normal pulses.  Pulmonary:     Effort: Pulmonary effort is normal.  Musculoskeletal:     Cervical back: Normal range of motion and neck supple.  Lymphadenopathy:     Cervical: No cervical adenopathy.  Skin:    Findings: Erythema and rash present. Rash is macular and scaling.          Comments: 1st patch noted on left posterior neck  Neurological:     Mental Status: She is alert.    BP (!) 154/88 (BP Location: Left Arm, Patient Position: Sitting, Cuff Size: Normal) Comment: recheck done manual  Pulse 86   Temp 98.1 F  (36.7 C) (Oral)   Resp 18   Wt 179 lb 9.6 oz (81.5 kg)   LMP 12/02/2012 (Approximate)   SpO2 97%   BMI 30.59 kg/m    No results found for any visits on 04/28/23.     Assessment & Plan:   Problem List Items Addressed This Visit   None Visit Diagnoses       Pityriasis    -  Primary   Relevant Orders   CBC with Differential/Platelet   Sedimentation rate   C-reactive protein   ANA w/Reflex   Basic metabolic panel     Pityriasis rosea vs autoimmune rash? Advised to start oral antihistamine while waiting for lab results.  No orders of the defined types were placed in this encounter.  Return if symptoms worsen or fail to improve.  Alysia Penna, NP

## 2023-04-29 LAB — ANA W/REFLEX: Anti Nuclear Antibody (ANA): NEGATIVE

## 2023-06-25 ENCOUNTER — Encounter: Payer: Self-pay | Admitting: Internal Medicine

## 2023-06-25 NOTE — Telephone Encounter (Signed)
FYI - I called patient, patient advised to go to orthopedics for x-ray to make sure patient doesn't have a fracture, patient's pain and swelling is only in the right ankle.

## 2023-07-08 NOTE — Telephone Encounter (Signed)
 I spoke with [name] symptoms have continued to partially improve since she sent the last message.  She is having some persistent pain more now in the back of heel and calf or leg but pain in the ankle is doing better.  Not a lot of visible swelling at this point.  I do not think need to bring her in for a physical exam or any imaging or lab test for RA flare at this time.  She can contact the office again if symptoms deteriorate or as needed otherwise we will see her back in April.

## 2023-07-15 ENCOUNTER — Telehealth: Payer: Self-pay

## 2023-07-15 NOTE — Telephone Encounter (Signed)
 Copied from CRM [id number]. Topic: General - Other >> Jul 15, 2023  1:29 PM Jon Gills C wrote: Reason for CRM: Patient called in stating she had a bone density test taken when she got that bon density test she was under the impression that it was a preventative test, but her insurance called her to inform her that it had a diagnosis with it. So she has stated that she has to pay that out full price. The patient would like for someone to give her a callback on this issue to explain to her what should she do now.   Can you help with this? Thanks. Dm/cma

## 2023-07-15 NOTE — Telephone Encounter (Signed)
 Asymptomatic postmenopausal estrogen deficiency [Z78.0]   Called pt and advised the code above was submitted on the order to [facility] for her bone density. Pt will contact [facility] billing and her insurance to determine code filed.

## 2023-07-20 ENCOUNTER — Encounter: Payer: Self-pay | Admitting: Obstetrics

## 2023-07-20 ENCOUNTER — Ambulatory Visit: Payer: Commercial Managed Care - PPO | Admitting: Obstetrics

## 2023-07-20 VITALS — BP 142/75 | HR 78

## 2023-07-20 DIAGNOSIS — R351 Nocturia: Secondary | ICD-10-CM | POA: Diagnosis not present

## 2023-07-20 DIAGNOSIS — N8110 Cystocele, unspecified: Secondary | ICD-10-CM | POA: Diagnosis not present

## 2023-07-20 DIAGNOSIS — N952 Postmenopausal atrophic vaginitis: Secondary | ICD-10-CM | POA: Diagnosis not present

## 2023-07-20 NOTE — Progress Notes (Signed)
 [facility] Return Visit  SUBJECTIVE  History of Present Illness: Julie [name] is a 63 y.o. female seen in follow-up for nocturia, stage II pelvic organ prolapse, vaginal atrophy. Plan at last visit was vaginal estrogen.   Walks most days with skin irritation after, now resolved since vaginal estrogen Voids 1-2x/night when she has difficulty sleeping Denies tea at bedtime, previously 1hr of fiber use prior to bedtime due to avoidance of other medications.  Bowel movements improved with reduced straining, reports some awareness of bulge after bowel movements. Denies stress or bother over vaginal bulge. Reports some decrease in frequency of Kegels.   Past Medical History: Patient  has a past medical history of Allergy (Early 2000s), Foot pain, bilateral, Hypercholesteremia, Hypertension, Positive ANA (antinuclear antibody) (03/09/2013), Prediabetes, and Rheumatoid arthritis (HCC).   Past Surgical History: She  has a past surgical history that includes Colonoscopy (03/11/2012).   Medications: She has a current medication list which includes the following prescription(s): alpha-lipoic acid, ascorbic acid, barberry-oreg grape-goldenseal, beta carotene, turmeric curcumin, vitamin d, cyanocobalamin, estradiol, hydroxychloroquine, lactobacillus acidophilus, lisinopril, magnesium, melatonin, menaquinone-7, milk thistle, minocycline, misc natural products, multiple vitamins-minerals, and omega-3 fatty acids.   Allergies: Patient is allergic to zithromax [azithromycin].   Social History: Patient  reports that she has never smoked. She has never been exposed to tobacco smoke. She has never used smokeless tobacco. She reports that she does not drink alcohol and does not use drugs.     OBJECTIVE     Physical Exam: Vitals:   07/20/23 0859  BP: (!) 142/75  Pulse: 78   Gen: No apparent distress, A&O x 3.  Detailed Urogynecologic Evaluation:  Deferred.      No data to  display             ASSESSMENT AND PLAN    Julie [name] is a 63 y.o. with:  1. Pelvic organ prolapse quantification stage 2 cystocele   2. Nocturia   3. Vaginal atrophy     Pelvic organ prolapse quantification stage 2 cystocele Assessment & Plan: - For treatment of pelvic organ prolapse, we discussed options for management including expectant management, conservative management, and surgical management, such as Kegels, a pessary, pelvic floor physical therapy, and specific surgical procedures. - encouraged to resume regular Kegel exercises and continue to monitor stool consistency - pt to return if symptoms return encouraged to consider pelvic floor PT and pessary if she desires to avoid surgical intervention, continue expectant management at this time   Nocturia Assessment & Plan: - attributed to sleep disruption - avoid fluid intake 3 hrs before bedtime - prior POCT trace leuk, denies UTI symptoms - reports minimal improvement, desires expectant management at this time   Vaginal atrophy Assessment & Plan: - For symptomatic vaginal atrophy options include lubrication with a water-based lubricant, personal hygiene measures and barrier protection against wetness, and estrogen replacement in the form of vaginal cream, vaginal tablets, or a time-released vaginal ring.   - continues low dose vaginal estradiol with reduction of vaginal irritation after walks, continue use    Loleta Chance, MD

## 2023-07-20 NOTE — Patient Instructions (Signed)
 Continue vaginal estrogen 1g twice a week.    Continue to monitor stool consistency and fiber supplementation.   Please return for evaluation if you experience any change in vaginal or urinary symptoms.

## 2023-07-20 NOTE — Assessment & Plan Note (Signed)
-   For treatment of pelvic organ prolapse, we discussed options for management including expectant management, conservative management, and surgical management, such as Kegels, a pessary, pelvic floor physical therapy, and specific surgical procedures. - encouraged to resume regular Kegel exercises and continue to monitor stool consistency - pt to return if symptoms return encouraged to consider pelvic floor PT and pessary if she desires to avoid surgical intervention, continue expectant management at this time

## 2023-07-20 NOTE — Assessment & Plan Note (Signed)
-   attributed to sleep disruption - avoid fluid intake 3 hrs before bedtime - prior POCT trace leuk, denies UTI symptoms - reports minimal improvement, desires expectant management at this time

## 2023-07-20 NOTE — Assessment & Plan Note (Signed)
-   For symptomatic vaginal atrophy options include lubrication with a water-based lubricant, personal hygiene measures and barrier protection against wetness, and estrogen replacement in the form of vaginal cream, vaginal tablets, or a time-released vaginal ring.   - continues low dose vaginal estradiol with reduction of vaginal irritation after walks, continue use

## 2023-08-18 NOTE — Progress Notes (Signed)
 Office Visit Note  Patient: Julie [name]             Date of Birth: 26-Jan-1961           MRN: [id number]             PCP: Kandace Organ, NP Referring: Kandace Organ, NP Visit Date: 08/31/2023   Subjective:  Follow-up   Discussed the use of AI scribe software for clinical note transcription with the patient, who gave verbal consent to proceed.  History of Present Illness   Julie [name] is a 63 y.o. female here for follow up for seropositive RA on hydroxychloroquine  200 mg daily and minocycline  100 mg BID on 3 days weekly.    A few months ago, she developed a rash on her trunk, attributed to a viral infection, during which her sed rate was elevated, an unusual finding for her. There were no ongoing issues at the time of the elevated sed rate.  She sprained her ankle in January, which has shown improvement with most swelling resolved. However, she continues to experience tightness and minor pain, particularly when descending stairs. The pain is described as moving around the ankle area and is not noticeable during regular walking. She has resumed activities such as yoga and walking.  She denies any issues with her current medications and reports no new stiffness, lumps, or bumps elsewhere in her body. Her right ankle remains slightly larger than the left, similar to a previous ankle sprain 20 years ago, which also took a long time to heal. She experiences mild stiffness and pain in the morning, which improves as the day progresses.  She has been performing calf and ankle exercises and stretches, which she finds beneficial. She does not report any significant limitations in her range of motion.    Previous HPI 03/02/2023 Julie [name] is a 63 y.o. female here for follow up for seropositive RA on hydroxychloroquine  200 mg daily and minocycline  100 mg BID on 3 days weekly.  Overall she is doing very well with no major flareups of joint pain or swelling.  Does not  experience any new rashes or side effect with medications.  She had a recent episode of gastrointestinal symptoms with diarrhea and had some associated fevers this resolved on its own within 1 week with supportive care and did not require any antibiotics.  Did not have any sick contacts.  Has not had any similar episode for at least years.  She had a brief disruption in her medication refills and came for labs in August with blood count and metabolic panel look fine.     Previous HPI 09/02/2022 Julie [name] is a 63 y.o. female here for follow up for seropositive RA on hydroxychloroquine  200 mg daily.  Overall she has been doing well without major symptom exacerbation.  She was sick with COVID since her last visit she had a lot of fatigue associated with this but uncomplicated and resolved without additional complications.  No infections requiring antibiotic treatment.  She is having to change providers who her primary care and OB/GYN.  Has been on minocycline  taking 100 mg twice daily for 3 days of the week which she has taken for years with significant arthritis benefit.   Previous HPI 03/03/22 Julie [name] is a 63 y.o. 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 Symptoms are doing well overall with no significant morning stiffness. She has a small area of persistent swelling on th  flexor side proximal to her right wrist but no associated pain. She estimates needing to take NSAIDs only once since last visit. She has some clicking or popping in the medial side of her left knee also not particularly painful.   Previous HPI 08/20/2021 Julie [name] is a 63 y.o. female here for follow up for seropositive RA. She was taking HCQ but felt some jittery or tremulousness symptoms so interrupting taking this medication. She was taking diclofenac  75 mg as needed for joint inflammation but stopped this entirely since October last year and has done well off treatment. Joint swelling is  improved she notices a mild amount persistent on volar aspect of right wrist. Wrist pain tends to be a bit worse with extended position such as her twice weekly yoga class. Anxiety symptoms she asked about with the plaquenil  improved on their own without discontinuing the medication.   Previous HPI 02/10/21 Julie [name] is a 63 y.o. female here for follow up for seropositive rheumatoid arthritis after restarting hydroxychloroquine  400 mg PO Monday-Friday after initial visit 8 weeks ago. Baseline labs obtained at initial visit showing normal sedimentation rate and negative for hepatitis and TB screening.  Starting the medicine she noticed a few areas of improvement.  Swelling has decreased on the radial aspect of the wrist though swelling continues on the ulnar side and down to the fifth finger.  She is no longer having pain radiating up from the wrist or triggering of her fingers.  Morning stiffness is decreased.  She also feels decreased pain and stiffness in the right shoulder and in bilateral knees.  She denies any problems taking the medication she tried increasing it to 10 tablets/week but went back down to once daily due to ease.   Previous HPI 12/16/20 Julie [name] is a 63 y.o. female here for inflammatory arthritis of multiple sites with positive ANA and positive CCP Abs. She was originally diagnosed in 2014 with inflammation of right second and third fingers and left second finger.  She has had concerns with systemic immunosuppression and side effects.  She started treatment with minocycline  since 2014 she felt initially her symptoms got significantly worse before she did eventually feel some improvement.  She was also started on hydroxychloroquine  that she took for about 2 years but has been off this since 2018.  Throughout this time she is continued intermittent oral NSAID use. Symptoms have been persistently in some amount of exacerbation since around March of this year. She was seen In sports  medicine clinic with concern for right wrist synovitis and left knee medial meniscal injury and treatment with oral prednisone  and diclofenac .  She did not notice very much change with 20 mg prednisone  for 1 week duration. She has been taking diclofenac  twice daily  recently due to the increased symptoms, also with right shoulder pain. She notices some stiffness and decreased mobility of her right hand fingers but only swelling in the wrist.   Labs reviewed 03/2018 CCP >200   Imaging reviewed 09/2020 Xray right wrist No acute injury, no significant arthritis changes 09/2020 Xray left knee Mild osteoarthritis without effusion or erosions or abnormal calcifications   Review of Systems  Constitutional:  Negative for fatigue.  HENT:  Negative for mouth sores and mouth dryness.   Eyes:  Negative for dryness.  Respiratory:  Negative for shortness of breath.   Cardiovascular:  Negative for chest pain and palpitations.  Gastrointestinal:  Negative for blood in stool, constipation and diarrhea.  Endocrine: Negative for  increased urination.  Genitourinary:  Negative for involuntary urination.  Musculoskeletal:  Negative for joint pain, gait problem, joint pain, joint swelling, myalgias, muscle weakness, morning stiffness, muscle tenderness and myalgias.  Skin:  Negative for color change, rash, hair loss and sensitivity to sunlight.  Allergic/Immunologic: Negative for susceptible to infections.  Neurological:  Negative for dizziness and headaches.  Hematological:  Negative for swollen glands.  Psychiatric/Behavioral:  Negative for depressed mood and sleep disturbance. The patient is not nervous/anxious.     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   Hyperlipidemia 02/03/2017   Hypertension 02/03/2017   Impaired fasting glucose 02/03/2017   Synovitis of hand 12/27/2013   Multiple pigmented nevi 12/15/2013   Positive ANA (antinuclear antibody) 03/09/2013   OA (osteoarthritis) 02/23/2013   Rheumatoid arthritis (HCC) 02/23/2013    Past Medical History:  Diagnosis Date   Allergy Early 2000s   Zithromax   Foot pain, bilateral    Hypercholesteremia    Hypertension    Positive ANA (antinuclear antibody) 03/09/2013   Prediabetes    Rheumatoid arthritis (HCC)     Family History  Problem Relation Age of Onset   Diabetes Mother    Hypertension Mother    Depression Mother    Asthma Mother    COPD Mother    Kidney disease Mother    Diabetes Father    Multiple myeloma Father    Hypertension Father    Thyroid  disease Sister    Hypertension Brother    Rheum arthritis Maternal Uncle    Rheum arthritis Maternal Grandmother    Arthritis Maternal Grandmother    Rheum arthritis Cousin    Past Surgical History:  Procedure Laterality Date   COLONOSCOPY  03/11/2012   (2) polyps   Social History   Social History Narrative   Not on file   Immunization History  Administered Date(s) Administered   DTaP 11/25/2010   Fluad Trivalent(High Dose 65+) 03/30/2023   Influenza Inj Mdck Quad Pf 02/14/2019   Influenza, High Dose Seasonal PF 04/24/2011, 03/19/2012   Influenza, Seasonal, Injecte, Preservative Fre 03/30/2013   Influenza,inj,Quad PF,6+ Mos 03/30/2013, 02/20/2022   Influenza-Unspecified 03/05/2021   PFIZER(Purple Top)SARS-COV-2 Vaccination 08/18/2019, 09/15/2019, 04/08/2020, 09/02/2020   Tdap 04/25/2021   Zoster, Unspecified 03/17/2022, 06/17/2022     Objective: Vital Signs: BP 130/79 (BP Location: Left Arm, Patient Position: Sitting, Cuff Size: Normal)    Pulse 79   Ht 5' 4.5" (1.638 m)   Wt 186 lb (84.4 kg)   LMP 12/02/2012 (Approximate)   BMI 31.43 kg/m    Physical Exam Cardiovascular:     Rate and Rhythm: Normal rate and regular rhythm.  Pulmonary:     Effort: Pulmonary effort is normal.     Breath sounds: Normal breath sounds.  Skin:    General: Skin is warm and dry.  Neurological:     Mental Status: She is alert.  Psychiatric:        Mood and Affect: Mood normal.      Musculoskeletal Exam:  Shoulders full ROM no tenderness or swelling Elbows full ROM no tenderness or swelling  Wrists full ROM no tenderness or swelling Fingers full ROM no tenderness or swelling No paraspinal tenderness to palpation over upper and lower back Hip normal internal and external rotation without pain, no tenderness to lateral hip palpation Knees full ROM no tenderness or swelling Ankles full ROM right with mild lateral soft tissue thickening or trace swelling, tenderness to pressure at achilles tendon complex    Investigation: No additional findings.  Imaging: No results found.  Recent Labs: Lab Results  Component Value Date   WBC 6.4 08/31/2023   HGB 14.8 08/31/2023   PLT 254 08/31/2023   NA 141 08/31/2023   K 4.4 08/31/2023   CL 105 08/31/2023   CO2 24 08/31/2023   GLUCOSE 89 08/31/2023   BUN 22 08/31/2023   CREATININE 0.74 08/31/2023   BILITOT 0.3 08/31/2023   AST 14 08/31/2023   ALT 12 08/31/2023   PROT 7.0 08/31/2023   CALCIUM 9.0 08/31/2023   QFTBGOLDPLUS NEGATIVE 12/16/2020    Speciality Comments: PLQ Eye Exam: 03/03/2023 WNL @ [facility] [place] f/y 1 year  Procedures:  No procedures performed Allergies: Zithromax [azithromycin]   Assessment / Plan:     Visit Diagnoses: Rheumatoid arthritis involving right wrist with positive rheumatoid factor (HCC) - Plan: Sedimentation rate No severe flare disease exacerbation reported since last visit.  No peripheral joint synovitis is definite on exam  today.  There may be some soft tissue swelling or inflammation at the right ankle but with recent mild injury this would not be unusual. - Checking sedimentation rate for disease activity monitoring - Continue hydroxychloroquine  200 mg daily - Continue minocycline  100 mg twice daily 3 days weekly  High risk medication use - Hydroxychloroquine  200 mg daily, Minocycline  100 mg twice daily on 3 days weekly. PLQ Eye Exam: 03/03/2023 WNL - Plan: CBC with Differential/Platelet, Comprehensive metabolic panel with GFR - Rechecking CBC and CMP for medication monitoring continue long-term use of hydroxychloroquine  and minocycline   Osteoarthritis of ankle Achilles tendonitis Chronic osteoarthritis with bone spurring and joint lining thickening on ultrasound. Chronic Achilles tendonitis with inflammation and increased blood flow on ultrasound. Symptoms include tightness and minor pain, especially when descending stairs. Likely related to previous ankle sprain with residual inflammation. - Recommend calf and ankle stretching exercises.   Orders: Orders Placed This Encounter  Procedures   Sedimentation rate   CBC with Differential/Platelet   Comprehensive metabolic panel with GFR   No orders of the defined types were placed in this encounter.    Follow-Up Instructions: Return in about 6 months (around 03/01/2024) for RA on HCQ/mino f/u 6mos.   Matt Song, MD  Note - This record has been created using [employer].  Chart creation errors have been sought, but may not always  have been located. Such creation errors do not reflect on  the standard of medical care.

## 2023-08-31 ENCOUNTER — Encounter: Payer: Self-pay | Admitting: Internal Medicine

## 2023-08-31 ENCOUNTER — Ambulatory Visit: Payer: Commercial Managed Care - PPO | Attending: Internal Medicine | Admitting: Internal Medicine

## 2023-08-31 VITALS — BP 130/79 | HR 79 | Ht 64.5 in | Wt 186.0 lb

## 2023-08-31 DIAGNOSIS — M25471 Effusion, right ankle: Secondary | ICD-10-CM | POA: Diagnosis not present

## 2023-08-31 DIAGNOSIS — Z79899 Other long term (current) drug therapy: Secondary | ICD-10-CM

## 2023-08-31 DIAGNOSIS — M05731 Rheumatoid arthritis with rheumatoid factor of right wrist without organ or systems involvement: Secondary | ICD-10-CM

## 2023-09-01 LAB — COMPREHENSIVE METABOLIC PANEL WITH GFR
AG Ratio: 1.8 (calc) (ref 1.0–2.5)
ALT: 12 U/L (ref 6–29)
AST: 14 U/L (ref 10–35)
Albumin: 4.5 g/dL (ref 3.6–5.1)
Alkaline phosphatase (APISO): 68 U/L (ref 37–153)
BUN: 22 mg/dL (ref 7–25)
CO2: 24 mmol/L (ref 20–32)
Calcium: 9 mg/dL (ref 8.6–10.4)
Chloride: 105 mmol/L (ref 98–110)
Creat: 0.74 mg/dL (ref 0.50–1.05)
Globulin: 2.5 g/dL (ref 1.9–3.7)
Glucose, Bld: 89 mg/dL (ref 65–99)
Potassium: 4.4 mmol/L (ref 3.5–5.3)
Sodium: 141 mmol/L (ref 135–146)
Total Bilirubin: 0.3 mg/dL (ref 0.2–1.2)
Total Protein: 7 g/dL (ref 6.1–8.1)
eGFR: 90 mL/min/{1.73_m2} (ref >=60)

## 2023-09-01 LAB — CBC WITH DIFFERENTIAL/PLATELET
Absolute Lymphocytes: 1798 {cells}/uL (ref 850–3900)
Absolute Monocytes: 403 {cells}/uL (ref 200–950)
Basophils Absolute: 32 {cells}/uL (ref 0–200)
Basophils Relative: 0.5 %
Eosinophils Absolute: 192 {cells}/uL (ref 15–500)
Eosinophils Relative: 3 %
HCT: 44.6 % (ref 35.0–45.0)
Hemoglobin: 14.8 g/dL (ref 11.7–15.5)
MCH: 28.5 pg (ref 27.0–33.0)
MCHC: 33.2 g/dL (ref 32.0–36.0)
MCV: 85.8 fL (ref 80.0–100.0)
MPV: 10.7 fL (ref 7.5–12.5)
Monocytes Relative: 6.3 %
Neutro Abs: 3974 {cells}/uL (ref 1500–7800)
Neutrophils Relative %: 62.1 %
Platelets: 254 10*3/uL (ref 140–400)
RBC: 5.2 10*6/uL — ABNORMAL HIGH (ref 3.80–5.10)
RDW: 13.5 % (ref 11.0–15.0)
Total Lymphocyte: 28.1 %
WBC: 6.4 10*3/uL (ref 3.8–10.8)

## 2023-09-01 LAB — SEDIMENTATION RATE: Sed Rate: 9 mm/h (ref 0–30)

## 2023-09-01 NOTE — Telephone Encounter (Signed)
 Last Fill: 03/02/2023  Eye exam: 03/03/2023 WNL   Labs: 08/31/2023 RBC 5.20  Next Visit: 03/01/2024  Last Visit: 08/31/2023  [id number] arthritis involving right wrist with positive rheumatoid factor (HCC)   Current Dose per office note 08/31/2023: Hydroxychloroquine  200 mg daily, Minocycline  100 mg twice daily on 3 days weekly.   Okay to refill Plaquenil  and Minocycline ?

## 2023-09-02 MED ORDER — MINOCYCLINE HCL 50 MG PO CAPS: ORAL_CAPSULE | ORAL | 0 refills | Status: DC

## 2023-09-02 MED ORDER — HYDROXYCHLOROQUINE SULFATE 200 MG PO TABS: 200.0000 mg | ORAL_TABLET | Freq: Every day | ORAL | 0 refills | Status: DC

## 2023-09-05 ENCOUNTER — Encounter: Payer: Self-pay | Admitting: Internal Medicine

## 2023-10-08 ENCOUNTER — Encounter: Payer: Self-pay | Admitting: Nurse Practitioner

## 2023-10-08 ENCOUNTER — Ambulatory Visit (INDEPENDENT_AMBULATORY_CARE_PROVIDER_SITE_OTHER): Admitting: Nurse Practitioner

## 2023-10-08 VITALS — BP 140/86 | HR 76 | Temp 96.7°F | Ht 64.5 in | Wt 187.0 lb

## 2023-10-08 DIAGNOSIS — R7301 Impaired fasting glucose: Secondary | ICD-10-CM

## 2023-10-08 DIAGNOSIS — E782 Mixed hyperlipidemia: Secondary | ICD-10-CM | POA: Diagnosis not present

## 2023-10-08 DIAGNOSIS — I10 Essential (primary) hypertension: Secondary | ICD-10-CM | POA: Diagnosis not present

## 2023-10-08 MED ORDER — LISINOPRIL 5 MG PO TABS: 5.0000 mg | ORAL_TABLET | Freq: Every day | ORAL | 1 refills | Status: DC

## 2023-10-08 NOTE — Progress Notes (Signed)
 Established Patient Visit  Patient: Julie [name]   DOB: 02/17/1961   63 y.o. Female  MRN: [id number] Visit Date: 10/08/2023  Subjective:     Chief Complaint  Patient presents with   Medication Refill    Needs refills on lisinopril    Medication Refill   Impaired fasting glucose Declined repeat hgbA1c  Hyperlipidemia Declined repeat lipid panel  Hypertension Home BP reading for last 57month: 120/79, 140/86, 131/83, 111/73, 121/79, 129/73, 132/75, 137/67. Elevated BP in office BP Readings from Last 3 Encounters:  10/08/23 (!) 140/86  08/31/23 130/79  07/20/23 (!) 142/75    Possible white coat syndrome, but need to check home BP machine for accuracy Advised about need to schedule nurse visit to check BP machine. Maintain med dose Med refill sent F/up in 6months   Reviewed medical, surgical, and social history today  Medications: Outpatient Medications Prior to Visit  Medication Sig   Alpha-Lipoic Acid 600 MG CAPS Take 600 mg by mouth 2 (two) times daily.   Ascorbic Acid (VITAMIN C PO) Take 2,000 mg by mouth 2 (two) times daily.   Barberry-Oreg Grape-Goldenseal (BERBERINE COMPLEX PO) Take by mouth.   Black Pepper-Turmeric (TURMERIC CURCUMIN) 09-998 MG CAPS Take 1,000 mg by mouth 2 (two) times daily.   Cholecalciferol (VITAMIN D PO) Take 10,000 Int'l Units by mouth daily.   Cyanocobalamin (VITAMIN B 12 PO) Take by mouth.   estradiol  (ESTRACE ) 0.1 MG/GM vaginal cream Place 1 g vaginally 2 (two) times a week. Place 0.5g nightly for two weeks then twice a week after   hydroxychloroquine  (PLAQUENIL ) 200 MG tablet Take 1 tablet (200 mg total) by mouth daily.   lactobacillus acidophilus (BACID) TABS tablet Take 1 tablet by mouth daily.   MAGNESIUM PO Take 400 mg by mouth. 5 days per week   Melatonin 5 MG CAPS Take by mouth.   Menaquinone-7 (K2 PO) Take by mouth.   MILK THISTLE PO Take 600 mg by mouth daily.   minocycline  (MINOCIN ) 50 MG capsule Takes 100mg   twice daily on Mon, Wed, Fri   MISC NATURAL PRODUCTS PO Take by mouth. Holy Basil   Multiple Vitamins-Minerals (ZINC PO) Take by mouth.   Omega-3 Fatty Acids (OMEGA-3 FISH OIL PO) Take by mouth.   [DISCONTINUED] lisinopril  (ZESTRIL ) 5 MG tablet Take 1 tablet (5 mg total) by mouth daily.   beta carotene 25000 UNIT capsule Take 20,000 Units by mouth once a week. Vitamin A (Patient not taking: Reported on 10/08/2023)   No facility-administered medications prior to visit.   Reviewed past medical and social history.   ROS per HPI above  Last CBC Lab Results  Component Value Date   WBC 6.4 08/31/2023   HGB 14.8 08/31/2023   HCT 44.6 08/31/2023   MCV 85.8 08/31/2023   MCH 28.5 08/31/2023   RDW 13.5 08/31/2023   PLT 254 08/31/2023   Last metabolic panel Lab Results  Component Value Date   GLUCOSE 89 08/31/2023   NA 141 08/31/2023   K 4.4 08/31/2023   CL 105 08/31/2023   CO2 24 08/31/2023   BUN 22 08/31/2023   CREATININE 0.74 08/31/2023   EGFR 90 08/31/2023   CALCIUM 9.0 08/31/2023   PROT 7.0 08/31/2023   BILITOT 0.3 08/31/2023   AST 14 08/31/2023   ALT 12 08/31/2023   Last lipids Lab Results  Component Value Date   CHOL 217 (H) 03/30/2023   HDL 39.40  03/30/2023   LDLCALC 151 (H) 03/30/2023   TRIG 132.0 03/30/2023   CHOLHDL 6 03/30/2023   Last hemoglobin A1c Lab Results  Component Value Date   HGBA1C 5.9 03/30/2023   Last thyroid  functions Lab Results  Component Value Date   TSH 2.27 03/30/2023        Objective:  BP (!) 140/86 (BP Location: Left Arm, Patient Position: Sitting, Cuff Size: Normal)   Pulse 76   Temp (!) 96.7 F (35.9 C) (Temporal)   Ht 5' 4.5" (1.638 m)   Wt 187 lb (84.8 kg)   LMP 12/02/2012 (Approximate)   SpO2 99%   BMI 31.60 kg/m      Physical Exam Vitals and nursing note reviewed.  Cardiovascular:     Rate and Rhythm: Normal rate.     Pulses: Normal pulses.  Pulmonary:     Effort: Pulmonary effort is normal.  Musculoskeletal:      Right lower leg: No edema.     Left lower leg: No edema.  Neurological:     Mental Status: She is alert and oriented to person, place, and time.     No results found for any visits on 10/08/23.    Assessment & Plan:    Problem List Items Addressed This Visit     Hyperlipidemia   Declined repeat lipid panel      Relevant Medications   lisinopril  (ZESTRIL ) 5 MG tablet   Hypertension   Home BP reading for last 82month: 120/79, 140/86, 131/83, 111/73, 121/79, 129/73, 132/75, 137/67. Elevated BP in office BP Readings from Last 3 Encounters:  10/08/23 (!) 140/86  08/31/23 130/79  07/20/23 (!) 142/75    Possible white coat syndrome, but need to check home BP machine for accuracy Advised about need to schedule nurse visit to check BP machine. Maintain med dose Med refill sent F/up in 6months      Relevant Medications   lisinopril  (ZESTRIL ) 5 MG tablet   Impaired fasting glucose - Primary   Declined repeat hgbA1c      Return in about 6 months (around 04/09/2024) for HTN, hyperlipidemia (fasting).     Kathrene Parents, NP

## 2023-10-08 NOTE — Assessment & Plan Note (Signed)
 Home BP reading for last 8month: 120/79, 140/86, 131/83, 111/73, 121/79, 129/73, 132/75, 137/67. Elevated BP in office BP Readings from Last 3 Encounters:  10/08/23 (!) 140/86  08/31/23 130/79  07/20/23 (!) 142/75    Possible white coat syndrome, but need to check home BP machine for accuracy Advised about need to schedule nurse visit to check BP machine. Maintain med dose Med refill sent F/up in 6months

## 2023-10-08 NOTE — Assessment & Plan Note (Signed)
 Declined repeat hgbA1c

## 2023-10-08 NOTE — Assessment & Plan Note (Signed)
 Declined repeat lipid panel

## 2023-10-08 NOTE — Patient Instructions (Signed)
 Schedule nurse visit for BP machine to be checked. Maintain Heart healthy diet and daily exercise. Maintain current medications.

## 2023-10-22 ENCOUNTER — Telehealth: Payer: Self-pay | Admitting: Obstetrics and Gynecology

## 2023-10-22 DIAGNOSIS — E2839 Other primary ovarian failure: Secondary | ICD-10-CM

## 2023-10-22 NOTE — Telephone Encounter (Signed)
Needs bone scan

## 2023-11-02 ENCOUNTER — Ambulatory Visit (INDEPENDENT_AMBULATORY_CARE_PROVIDER_SITE_OTHER)

## 2023-11-02 DIAGNOSIS — I10 Essential (primary) hypertension: Secondary | ICD-10-CM

## 2023-11-02 NOTE — Progress Notes (Signed)
 Per Roselie Mood, NP Ruthe came in today for nurse visit for blood pressure check. Patient came in today for blood pressure check with her home monitor and to be checked manually.   Patient's B/P was taken in left arm and was 170/90, 83 with patient's monitor sitting in chair and checked again in left arm and was 162/95 with arm on flat surface.  Patient's B/P was taken in right arm manually by Laymon Gladis Sharps, CMA and was 162/96. I asked patient to wait 5 minutes and to recheck again her blood pressure again and patient refused. I notified Doc of the Day Dr. Sebastian of blood pressure readings.  I also advised patient if she has any concerns to please call the office and that I will Roselie Mood, NP know of above readings.

## 2023-11-03 ENCOUNTER — Encounter: Payer: Self-pay | Admitting: Nurse Practitioner

## 2023-11-03 NOTE — Assessment & Plan Note (Signed)
 Nurse visit confirms white coat syndrome. Home BP corresponds to manual BP check.

## 2023-12-08 ENCOUNTER — Other Ambulatory Visit: Payer: Self-pay | Admitting: Internal Medicine

## 2023-12-08 DIAGNOSIS — M05731 Rheumatoid arthritis with rheumatoid factor of right wrist without organ or systems involvement: Secondary | ICD-10-CM

## 2023-12-08 NOTE — Telephone Encounter (Signed)
 Last Fill: 09/02/2023  Eye exam: 03/03/2023 WNL   Labs: 08/31/2023  RBC 5.20 CMP WNL   Next Visit: 03/01/2024  Last Visit: 08/31/2023  DX: Rheumatoid arthritis involving right wrist with positive rheumatoid factor  Current Dose per office note 08/31/2023: Hydroxychloroquine  200 mg daily   Okay to refill Plaquenil ?

## 2023-12-12 ENCOUNTER — Other Ambulatory Visit: Payer: Self-pay | Admitting: Internal Medicine

## 2023-12-12 DIAGNOSIS — M05731 Rheumatoid arthritis with rheumatoid factor of right wrist without organ or systems involvement: Secondary | ICD-10-CM

## 2023-12-13 NOTE — Telephone Encounter (Signed)
 Patient states she does not need a refill yet.

## 2024-01-12 ENCOUNTER — Encounter: Payer: Self-pay | Admitting: Nurse Practitioner

## 2024-01-25 ENCOUNTER — Other Ambulatory Visit: Payer: Self-pay | Admitting: Internal Medicine

## 2024-01-25 DIAGNOSIS — M05731 Rheumatoid arthritis with rheumatoid factor of right wrist without organ or systems involvement: Secondary | ICD-10-CM

## 2024-01-26 NOTE — Telephone Encounter (Signed)
 Okay to recheck labs with next follow up visit on very low dose antibiotic.

## 2024-01-26 NOTE — Telephone Encounter (Signed)
 Last Fill: 09/02/2023  Labs: 08/31/2023 RBC 5.20  Next Visit: 03/01/2024  Last Visit: 08/31/2023  DX: Rheumatoid arthritis involving right wrist with positive rheumatoid factor   Current Dose per office note 08/31/2023: Minocycline  100 mg twice daily on 3 days weekly   Okay to refill Minocycline ? When should patient update labs?

## 2024-01-26 NOTE — Telephone Encounter (Signed)
 Abstracted the average into patient's chart

## 2024-02-11 DIAGNOSIS — Z79899 Other long term (current) drug therapy: Secondary | ICD-10-CM | POA: Diagnosis not present

## 2024-02-15 DIAGNOSIS — H43813 Vitreous degeneration, bilateral: Secondary | ICD-10-CM | POA: Diagnosis not present

## 2024-02-15 DIAGNOSIS — H33322 Round hole, left eye: Secondary | ICD-10-CM | POA: Diagnosis not present

## 2024-02-15 DIAGNOSIS — Z79899 Other long term (current) drug therapy: Secondary | ICD-10-CM | POA: Diagnosis not present

## 2024-02-15 DIAGNOSIS — H2513 Age-related nuclear cataract, bilateral: Secondary | ICD-10-CM | POA: Diagnosis not present

## 2024-02-16 NOTE — Progress Notes (Signed)
 Office Visit Note  Patient: Julie [name]             Date of Birth: [birth year]           MRN: [id number]             PCP: Katheen Roselie Rockford, NP Referring: Katheen Roselie Rockford, NP Visit Date: 03/01/2024   Subjective:  Discussed the use of AI scribe software for clinical note transcription with the patient, who gave verbal consent to proceed.  History of Present Illness   Julie [name] is a 63 y.o. female here for follow up for seropositive RA on hydroxychloroquine  200 mg daily and minocycline  100 mg BID on 3 days weekly.   Since her last visit, she has not experienced any flare-ups of her rheumatoid arthritis. She continues to take hydroxychloroquine  and minocycline  without any issues. She takes two pills of minocycline  in the morning and two in the evening. No recent illnesses, flare-ups of arthritis, or respiratory symptoms.  Earlier this year, she experienced a sprained right ankle, which caused residual stiffness and pain extending into the lower calf. These symptoms have resolved over time without intervention.  She has encountered issues with prescription refills, noting multiple interactions with office staff regarding refill policies. She describes receiving only a 90-day supply of medication despite her preference for a longer duration to align with her six-month follow-up schedule.   Previous HPI 08/31/2023 Julie [name] is a 63 y.o. female here for follow up for seropositive RA on hydroxychloroquine  200 mg daily and minocycline  100 mg BID on 3 days weekly.     A few months ago, she developed a rash on her trunk, attributed to a viral infection, during which her sed rate was elevated, an unusual finding for her. There were no ongoing issues at the time of the elevated sed rate.   She sprained her ankle in January, which has shown improvement with most swelling resolved. However, she continues to experience tightness and minor pain, particularly when descending stairs.  The pain is described as moving around the ankle area and is not noticeable during regular walking. She has resumed activities such as yoga and walking.   She denies any issues with her current medications and reports no new stiffness, lumps, or bumps elsewhere in her body. Her right ankle remains slightly larger than the left, similar to a previous ankle sprain 20 years ago, which also took a long time to heal. She experiences mild stiffness and pain in the morning, which improves as the day progresses.   She has been performing calf and ankle exercises and stretches, which she finds beneficial. She does not report any significant limitations in her range of motion.      Previous HPI 03/02/2023 Julie [name] is a 63 y.o. female here for follow up for seropositive RA on hydroxychloroquine  200 mg daily and minocycline  100 mg BID on 3 days weekly.  Overall she is doing very well with no major flareups of joint pain or swelling.  Does not experience any new rashes or side effect with medications.  She had a recent episode of gastrointestinal symptoms with diarrhea and had some associated fevers this resolved on its own within 1 week with supportive care and did not require any antibiotics.  Did not have any sick contacts.  Has not had any similar episode for at least years.  She had a brief disruption in her medication refills and came for labs in August with blood count and metabolic panel  look fine.     Previous HPI 09/02/2022 Julie [name] is a 63 y.o. female here for follow up for seropositive RA on hydroxychloroquine  200 mg daily.  Overall she has been doing well without major symptom exacerbation.  She was sick with COVID since her last visit she had a lot of fatigue associated with this but uncomplicated and resolved without additional complications.  No infections requiring antibiotic treatment.  She is having to change providers who her primary care and OB/GYN.  Has been on minocycline  taking  100 mg twice daily for 3 days of the week which she has taken for years with significant arthritis benefit.   Previous HPI 03/03/22 Julie [name] is a 63 y.o. 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 Symptoms are doing well overall with no significant morning stiffness. She has a small area of persistent swelling on th flexor side proximal to her right wrist but no associated pain. She estimates needing to take NSAIDs only once since last visit. She has some clicking or popping in the medial side of her left knee also not particularly painful.   Previous HPI 08/20/2021 Julie [name] is a 63 y.o. female here for follow up for seropositive RA. She was taking HCQ but felt some jittery or tremulousness symptoms so interrupting taking this medication. She was taking diclofenac  75 mg as needed for joint inflammation but stopped this entirely since October last year and has done well off treatment. Joint swelling is improved she notices a mild amount persistent on volar aspect of right wrist. Wrist pain tends to be a bit worse with extended position such as her twice weekly yoga class. Anxiety symptoms she asked about with the plaquenil  improved on their own without discontinuing the medication.   Previous HPI 02/10/21 Julie [name] is a 63 y.o. female here for follow up for seropositive rheumatoid arthritis after restarting hydroxychloroquine  400 mg PO Monday-Friday after initial visit 8 weeks ago. Baseline labs obtained at initial visit showing normal sedimentation rate and negative for hepatitis and TB screening.  Starting the medicine she noticed a few areas of improvement.  Swelling has decreased on the radial aspect of the wrist though swelling continues on the ulnar side and down to the fifth finger.  She is no longer having pain radiating up from the wrist or triggering of her fingers.  Morning stiffness is decreased.  She also feels decreased pain and stiffness in the  right shoulder and in bilateral knees.  She denies any problems taking the medication she tried increasing it to 10 tablets/week but went back down to once daily due to ease.   Previous HPI 12/16/20 Julie [name] is a 63 y.o. female here for inflammatory arthritis of multiple sites with positive ANA and positive CCP Abs. She was originally diagnosed in 2014 with inflammation of right second and third fingers and left second finger.  She has had concerns with systemic immunosuppression and side effects.  She started treatment with minocycline  since 2014 she felt initially her symptoms got significantly worse before she did eventually feel some improvement.  She was also started on hydroxychloroquine  that she took for about 2 years but has been off this since 2018.  Throughout this time she is continued intermittent oral NSAID use. Symptoms have been persistently in some amount of exacerbation since around March of this year. She was seen In sports medicine clinic with concern for right wrist synovitis and left knee medial meniscal injury and treatment  with oral prednisone  and diclofenac .  She did not notice very much change with 20 mg prednisone  for 1 week duration. She has been taking diclofenac  twice daily  recently due to the increased symptoms, also with right shoulder pain. She notices some stiffness and decreased mobility of her right hand fingers but only swelling in the wrist.   Labs reviewed 03/2018 CCP >200   Imaging reviewed 09/2020 Xray right wrist No acute injury, no significant arthritis changes 09/2020 Xray left knee Mild osteoarthritis without effusion or erosions or abnormal calcifications   Review of Systems  Constitutional:  Negative for fatigue.  HENT:  Negative for mouth sores and mouth dryness.   Eyes:  Negative for dryness.  Respiratory:  Negative for shortness of breath.   Cardiovascular:  Negative for chest pain and palpitations.  Gastrointestinal:  Positive for  constipation. Negative for blood in stool and diarrhea.  Endocrine: Negative for increased urination.  Genitourinary:  Negative for involuntary urination.  Musculoskeletal:  Negative for joint pain, gait problem, joint pain, joint swelling, myalgias, muscle weakness, morning stiffness, muscle tenderness and myalgias.  Skin:  Negative for color change, rash, hair loss and sensitivity to sunlight.  Allergic/Immunologic: Negative for susceptible to infections.  Neurological:  Negative for dizziness and headaches.  Hematological:  Negative for swollen glands.  Psychiatric/Behavioral:  Negative for depressed mood and sleep disturbance. The patient is not nervous/anxious.     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   Hyperlipidemia 02/03/2017   Hypertension 02/03/2017   Impaired fasting glucose 02/03/2017   Synovitis of hand 12/27/2013   Multiple pigmented nevi 12/15/2013   Positive ANA (antinuclear antibody) 03/09/2013   Rheumatoid arthritis (HCC) 02/23/2013    Past Medical History:  Diagnosis Date   Allergy Early 2000s   Zithromax   Foot pain, bilateral    Hypercholesteremia    Hypertension    Positive ANA (antinuclear antibody) 03/09/2013   Prediabetes    Rheumatoid arthritis (HCC)     Family History  Problem Relation Age of Onset   Diabetes Mother    Hypertension Mother    Depression Mother    Asthma Mother    COPD Mother    Kidney disease Mother    Diabetes Father    Multiple myeloma Father    Hypertension Father    Thyroid  disease Sister     Hypertension Brother    Rheum arthritis Maternal Uncle    Rheum arthritis Maternal Grandmother    Arthritis Maternal Grandmother    Rheum arthritis Cousin    Past Surgical History:  Procedure Laterality Date   COLONOSCOPY  03/11/2012   (2) polyps   Social History   Social History Narrative   Not on file   Immunization History  Administered Date(s) Administered   DTaP 11/25/2010   Fluad Trivalent(High Dose 65+) 03/30/2023   INFLUENZA, HIGH DOSE SEASONAL PF 04/24/2011, 03/19/2012   Influenza Inj Mdck Quad Pf 02/14/2019   Influenza, Seasonal, Injecte, Preservative Fre 03/30/2013   Influenza,inj,Quad PF,6+ Mos 03/30/2013, 02/20/2022   Influenza-Unspecified 03/05/2021   PFIZER(Purple Top)SARS-COV-2 Vaccination 08/18/2019, 09/15/2019, 04/08/2020, 09/02/2020  Tdap 04/25/2021   Zoster, Unspecified 03/17/2022, 06/17/2022     Objective: Vital Signs: BP (!) 142/83   Pulse 77   Temp 98.6 F (37 C)   Resp 14   Ht 5' 5 (1.651 m)   Wt 189 lb 9.6 oz (86 kg)   LMP 12/02/2012 (Approximate)   BMI 31.55 kg/m    Physical Exam HENT:     Mouth/Throat:     Mouth: Mucous membranes are moist.     Pharynx: Oropharynx is clear.  Eyes:     Conjunctiva/sclera: Conjunctivae normal.  Cardiovascular:     Rate and Rhythm: Normal rate and regular rhythm.  Pulmonary:     Effort: Pulmonary effort is normal.     Breath sounds: Normal breath sounds.     Comments: Faint end inspiratory crackles at left lower lung Musculoskeletal:     Right lower leg: No edema.     Left lower leg: No edema.  Skin:    General: Skin is warm and dry.     Findings: No rash.  Neurological:     Mental Status: She is alert.  Psychiatric:        Mood and Affect: Mood normal.      Musculoskeletal Exam:  Shoulders full ROM no tenderness or swelling Elbows full ROM no tenderness or swelling Wrists full ROM no tenderness or swelling Fingers full ROM no tenderness or swelling No paraspinal tenderness to  palpation over upper and lower back Hip normal internal and external rotation without pain, no tenderness to lateral hip palpation Knees full ROM no tenderness or swelling  Investigation: No additional findings.  Imaging: No results found.  Recent Labs: Lab Results  Component Value Date   WBC 6.4 08/31/2023   HGB 14.8 08/31/2023   PLT 254 08/31/2023   NA 141 08/31/2023   K 4.4 08/31/2023   CL 105 08/31/2023   CO2 24 08/31/2023   GLUCOSE 89 08/31/2023   BUN 22 08/31/2023   CREATININE 0.74 08/31/2023   BILITOT 0.3 08/31/2023   AST 14 08/31/2023   ALT 12 08/31/2023   PROT 7.0 08/31/2023   CALCIUM 9.0 08/31/2023   QFTBGOLDPLUS NEGATIVE 12/16/2020    Speciality Comments: Please send refill requests or messages to me if any discrepancies for labs or timing noted.  PLQ Eye Exam: 03/03/2023 WNL @ [facility] f/y 1 year  Procedures:  No procedures performed Allergies: Zithromax [azithromycin]   Assessment / Plan:     Visit Diagnoses: Rheumatoid arthritis involving right wrist with positive rheumatoid factor (HCC) - Plan: hydroxychloroquine  (PLAQUENIL ) 200 MG tablet, minocycline  (MINOCIN ) 50 MG capsule Rheumatoid arthritis well-managed with hydroxychloroquine  and minocycline . No adverse effects. Prescription refill issues noted. - Continue hydroxychloroquine  200 mg daily - Continue minocycline  100 mg twice daily 3 days weekly - Align prescription refills with follow-up appointments to avoid medication supply gaps. - Ensure clinic staff consults with provider if questions or concerns about refills arise.  High risk medication use - Hydroxychloroquine  200 mg daily, Minocycline  100 mg twice daily on 3 days weekly. Should be going for upcoming primary care annual follow-up visit based on labs to be updated at that time.  Okay to wait additional interval given no new medication changes good stability and no reported side effect.  Mild asymptomatic  atelectasis Faint basilar crackles but with good air movement suspect slight bit of atelectasis, could be related to dryness or allergies are otherwise nonspecific.  Much less likely suspect related anything to the RA.  We just monitor this for now, if staying persistent or any progression on repeat checking obtain additional imaging workup.      Orders: No orders of the defined types were placed in this encounter.  Meds ordered this encounter  Medications   hydroxychloroquine  (PLAQUENIL ) 200 MG tablet    Sig: Take 1 tablet (200 mg total) by mouth daily.    Dispense:  90 tablet    Refill:  2   minocycline  (MINOCIN ) 50 MG capsule    Sig: TAKE 2 CAPSULES BY MOUTH TWICE A DAY ON MONDAY, WEDNESDAY AND FRIDAY    Dispense:  144 capsule    Refill:  2     Follow-Up Instructions: No follow-ups on file.   Lonni LELON Ester, MD  Note - This record has been created using [employer].  Chart creation errors have been sought, but may not always  have been located. Such creation errors do not reflect on  the standard of medical care.

## 2024-03-01 ENCOUNTER — Encounter: Payer: Self-pay | Admitting: Internal Medicine

## 2024-03-01 ENCOUNTER — Ambulatory Visit: Attending: Internal Medicine | Admitting: Internal Medicine

## 2024-03-01 VITALS — BP 142/80 | HR 73 | Temp 98.6°F | Resp 14 | Ht 65.0 in | Wt 189.6 lb

## 2024-03-01 DIAGNOSIS — Z79899 Other long term (current) drug therapy: Secondary | ICD-10-CM

## 2024-03-01 DIAGNOSIS — M05731 Rheumatoid arthritis with rheumatoid factor of right wrist without organ or systems involvement: Secondary | ICD-10-CM | POA: Diagnosis not present

## 2024-03-01 MED ORDER — MINOCYCLINE HCL 50 MG PO CAPS: ORAL_CAPSULE | ORAL | 2 refills | Status: AC

## 2024-03-01 MED ORDER — HYDROXYCHLOROQUINE SULFATE 200 MG PO TABS: 200.0000 mg | ORAL_TABLET | Freq: Every day | ORAL | 2 refills | Status: AC

## 2024-03-03 ENCOUNTER — Telehealth: Payer: Self-pay

## 2024-03-03 NOTE — Telephone Encounter (Signed)
 Copied from CRM [id number]. Topic: Clinical - Request for Lab/Test Order >> Mar 03, 2024  9:21 AM Julie [name] wrote: Reason for CRM: patient requesting labs for physical prior to appt on 12/2, please.

## 2024-03-03 NOTE — Telephone Encounter (Signed)
 Please see message below.   Thanks,  Nataliah Hatlestad CCMA

## 2024-03-06 ENCOUNTER — Other Ambulatory Visit: Payer: Self-pay | Admitting: Internal Medicine

## 2024-03-06 DIAGNOSIS — M05731 Rheumatoid arthritis with rheumatoid factor of right wrist without organ or systems involvement: Secondary | ICD-10-CM

## 2024-03-07 DIAGNOSIS — H33322 Round hole, left eye: Secondary | ICD-10-CM | POA: Diagnosis not present

## 2024-03-07 NOTE — Telephone Encounter (Signed)
 Patient called and informed

## 2024-03-17 ENCOUNTER — Ambulatory Visit: Payer: Commercial Managed Care - PPO | Admitting: Obstetrics and Gynecology

## 2024-03-21 ENCOUNTER — Ambulatory Visit (INDEPENDENT_AMBULATORY_CARE_PROVIDER_SITE_OTHER): Admitting: Obstetrics and Gynecology

## 2024-03-21 ENCOUNTER — Other Ambulatory Visit (HOSPITAL_COMMUNITY)
Admission: RE | Admit: 2024-03-21 | Discharge: 2024-03-21 | Disposition: A | Discharge status: Home / Self Care | Source: Ambulatory Visit | Attending: Obstetrics and Gynecology | Admitting: Obstetrics and Gynecology

## 2024-03-21 ENCOUNTER — Encounter: Payer: Self-pay | Admitting: Obstetrics and Gynecology

## 2024-03-21 VITALS — BP 148/80 | HR 82 | Ht 64.0 in | Wt 188.4 lb

## 2024-03-21 DIAGNOSIS — Z01419 Encounter for gynecological examination (general) (routine) without abnormal findings: Secondary | ICD-10-CM | POA: Insufficient documentation

## 2024-03-21 DIAGNOSIS — Z23 Encounter for immunization: Secondary | ICD-10-CM | POA: Diagnosis not present

## 2024-03-21 NOTE — Progress Notes (Signed)
 63 y.o. y.o. female here for annual medicare exam. Patient's last menstrual period was 12/02/2012 (approximate).     G0P0 Married White or Caucasian Not Hispanic or Latino female here for annual exam.  Not sexually active. No vaginal bleeding.    She has a known grade 2 uterine prolapse.  Today at stage 3 with cervix at the hymen with valsalva. She feels like her bladder is slow to completely end towards the end and it takes longer.  She denies splinting. She has intermittent constipation. Having a BM daily,. Has seen Dr.Yuen. Using vaginal estrogen cream  H/O RA, currently doing well.   Patient's last menstrual period was 12/02/2012 (approximate).          Sexually active: No.  The current method of family planning is post menopausal status.    Exercising: Yes.    Walking and yoga  Smoker:  no  Health Maintenance: Pap:   02-16-18 neg HPV HR neg             12/26/14 Neg:Neg HR HPV History of abnormal Pap:  no MMG:  03/28/21 bi-rads 1 neg  BMD:   04/22/23 -1.8 normal frax. No history of fractures, h/o RA controlled. Not wanting bone support if avoidable Colonoscopy: 05/14/20 normal f/u 5 years  TDaP:  04/25/21 Gardasil: n/a  Body mass index is 30.79 kg/m. Last year Body mass index is 32.34 kg/m.     Blood pressure (!) 160/90, pulse 82, height 5' 4 (1.626 m), weight 188 lb 6.4 oz (85.5 kg), last menstrual period 12/02/2012, SpO2 98%.     Component Value Date/Time   DIAGPAP  03/17/2023 1548    - Negative for intraepithelial lesion or malignancy (NILM)   DIAGPAP  02/16/2018 0000    NEGATIVE FOR INTRAEPITHELIAL LESIONS OR MALIGNANCY.   HPVHIGH Negative 03/17/2023 1548   ADEQPAP  03/17/2023 1548    Satisfactory for evaluation. The presence or absence of an   ADEQPAP  03/17/2023 1548    endocervical/transformation zone component cannot be determined because   ADEQPAP of atrophy. 03/17/2023 1548    GYN HISTORY:    Component Value Date/Time   DIAGPAP  03/17/2023 1548     - Negative for intraepithelial lesion or malignancy (NILM)   DIAGPAP  02/16/2018 0000    NEGATIVE FOR INTRAEPITHELIAL LESIONS OR MALIGNANCY.   HPVHIGH Negative 03/17/2023 1548   ADEQPAP  03/17/2023 1548    Satisfactory for evaluation. The presence or absence of an   ADEQPAP  03/17/2023 1548    endocervical/transformation zone component cannot be determined because   ADEQPAP of atrophy. 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     Ascorbic Acid (VITAMIN C PO)  Take 2,000 mg by mouth 2 (two) times daily.     Barberry-Oreg Grape-Goldenseal (BERBERINE COMPLEX PO) Take by mouth.     Black Pepper-Turmeric (TURMERIC CURCUMIN) 09-998 MG CAPS Take 1,000 mg by mouth 2 (two) times daily.     Cholecalciferol (VITAMIN D PO) Take 10,000 Int'l Units by mouth daily.     Cyanocobalamin (VITAMIN B 12 PO) Take by mouth.     estradiol  (ESTRACE ) 0.1 MG/GM vaginal cream Place 1 g vaginally 2 (two) times a week. Place 0.5g nightly for two weeks then twice a week after 30 g 3   hydroxychloroquine  (PLAQUENIL ) 200 MG tablet Take 1 tablet (200 mg total) by mouth daily. 90 tablet 2   lactobacillus acidophilus (BACID) TABS tablet Take 1 tablet by mouth daily.     lisinopril  (ZESTRIL ) 5 MG tablet Take 1 tablet (5 mg total) by mouth daily. 90 tablet 1   MAGNESIUM PO Take 400 mg by  mouth. 5 days per week     Melatonin 5 MG CAPS Take by mouth.     Menaquinone-7 (K2 PO) Take by mouth.     MILK THISTLE PO Take 600 mg by mouth daily.     minocycline  (MINOCIN ) 50 MG capsule TAKE 2 CAPSULES BY MOUTH TWICE A DAY ON MONDAY, WEDNESDAY AND FRIDAY 144 capsule 2   MISC NATURAL PRODUCTS PO Take by mouth. Holy Basil     Multiple Vitamins-Minerals (ZINC PO) Take by mouth.     Omega-3 Fatty Acids (OMEGA-3 FISH OIL PO) Take by mouth.     No current facility-administered medications on file prior to visit.    Social History   Socioeconomic History   Marital status: Married    Spouse name: Not on file   Number of children: Not on file   Years of education: Not on file   Highest education level: Bachelor's degree (e.g., BA, AB, BS)  Occupational History   Not on file  Tobacco Use   Smoking status: Never    Passive exposure: Never   Smokeless tobacco: Never  Vaping Use   Vaping status: Never Used  Substance and Sexual Activity   Alcohol use: Never   Drug use: Never   Sexual activity: Not Currently    Partners: Male    Birth control/protection: Post-menopausal  Other Topics Concern   Not on file  Social History Narrative   Not on file   Social Drivers of Health   Financial Resource Strain: Low Risk  (04/27/2023)   Overall Financial Resource Strain (CARDIA)    Difficulty of Paying Living Expenses: Not hard at all  Food Insecurity: No Food Insecurity (04/27/2023)   Hunger Vital Sign    Worried About Running Out of Food in the Last Year: Never true    Ran Out of Food in the Last Year: Never true  Transportation Needs: No Transportation Needs (04/27/2023)   PRAPARE - Administrator, Civil Service (Medical): No    Lack of Transportation (Non-Medical): No  Physical Activity: Sufficiently Active (04/27/2023)   Exercise Vital Sign    Days of Exercise per Week: 5 days    Minutes of Exercise per Session: 40 min  Stress: Patient Declined (04/27/2023)    [employer] of Occupational Health - Occupational Stress Questionnaire    Feeling of Stress : Patient declined  Recent Concern: Stress - Stress Concern Present (03/29/2023)   [employer]ational [employer] - Occupational Stress Questionnaire    Feeling of Stress : To some extent  Social Connections: Unknown (04/27/2023)   Social Connection and Isolation Panel    Frequency of Communication with Friends and Family: Patient declined    Frequency of Social Gatherings with Friends and Family: Patient declined    Attends Religious Services: More than 4 times per year    Active Member of [employer] or Organizations: Yes    Attends Banker Meetings: Patient declined    Marital Status: Married  Catering Manager Violence: Unknown (08/13/2021)   Received from [facility]   HITS    Physically Hurt: Not on file    Insult or Talk Down To: Not on file    Threaten Physical Harm: Not on file    Scream or Curse: Not on file    Family History  Problem Relation Age of Onset   Diabetes Mother    Hypertension Mother    Depression Mother    Asthma Mother    COPD Mother    Kidney disease Mother    Diabetes Father    Multiple myeloma Father    Hypertension Father    Thyroid  disease Sister    Hypertension Brother    Rheum arthritis Maternal Uncle    Rheum arthritis Maternal Grandmother    Arthritis Maternal Grandmother    Rheum arthritis Cousin      Allergies  Allergen Reactions   Zithromax [Azithromycin] Hives      Patient's last menstrual period was Patient's last menstrual period was 12/02/2012 (approximate)..            Review of Systems Alls systems reviewed and are negative.     Physical Exam Constitutional:      Appearance: Normal appearance.  Genitourinary:     Vulva and urethral meatus normal.     No lesions in the vagina.     Right Labia: No rash, lesions or skin changes.    Left Labia: No lesions, skin changes or rash.    No vaginal discharge  or tenderness.     Anterior vaginal prolapse present.    Mild vaginal atrophy present.     Right Adnexa: not tender, not palpable and no mass present.    Left Adnexa: not tender, not palpable and no mass present.    No cervical motion tenderness or discharge.     Uterus is not enlarged, tender or irregular.  Breasts:    Right: Normal.     Left: Normal.  HENT:     Head: Normocephalic.  Neck:     Thyroid : No thyroid  mass, thyromegaly or thyroid  tenderness.  Cardiovascular:     Rate and Rhythm: Normal rate and regular rhythm.     Heart sounds: Normal heart sounds, S1 normal and S2 normal.  Pulmonary:     Effort: Pulmonary effort is normal.     Breath sounds: Normal breath sounds and air entry.  Abdominal:     General: There is no distension.     Palpations: Abdomen is soft. There is no mass.     Tenderness: There is no abdominal tenderness. There is no guarding or rebound.  Musculoskeletal:        General: Normal range of motion.     Cervical back: Full passive range of motion without pain, normal range of motion and neck supple. No tenderness.     Right lower leg: No edema.     Left lower leg: No edema.  Neurological:     Mental Status: She is alert.  Skin:    General: Skin is warm.  Psychiatric:        Mood and Affect: Mood normal.        Behavior: Behavior normal.        Thought Content: Thought content normal.  Vitals and nursing note reviewed. Exam conducted with a chaperone present.       A:         Well Woman GYN exam                             P:        Pap smear collected today Encouraged annual mammogram screening Colon cancer screening up-to-date DXA up-to-date osteopenia. Encouraged calcium and vit D and weight bearing exercises Labs and immunizations to do with PMD Encouraged healthy lifestyle practices Flu shot here today  No follow-ups on file.  Almarie MARLA Carpen

## 2024-03-24 ENCOUNTER — Ambulatory Visit: Payer: Self-pay | Admitting: Obstetrics and Gynecology

## 2024-03-24 LAB — CYTOLOGY - PAP
Adequacy: ABSENT
Diagnosis: NEGATIVE

## 2024-04-06 ENCOUNTER — Other Ambulatory Visit: Payer: Self-pay | Admitting: Nurse Practitioner

## 2024-04-06 DIAGNOSIS — I10 Essential (primary) hypertension: Secondary | ICD-10-CM

## 2024-04-10 ENCOUNTER — Encounter: Payer: Self-pay | Admitting: Nurse Practitioner

## 2024-04-11 ENCOUNTER — Encounter: Payer: Self-pay | Admitting: Nurse Practitioner

## 2024-04-11 ENCOUNTER — Ambulatory Visit (INDEPENDENT_AMBULATORY_CARE_PROVIDER_SITE_OTHER): Admitting: Nurse Practitioner

## 2024-04-11 VITALS — BP 132/80 | HR 81 | Temp 98.0°F | Ht 64.0 in | Wt 188.0 lb

## 2024-04-11 DIAGNOSIS — I10 Essential (primary) hypertension: Secondary | ICD-10-CM

## 2024-04-11 DIAGNOSIS — E782 Mixed hyperlipidemia: Secondary | ICD-10-CM | POA: Diagnosis not present

## 2024-04-11 DIAGNOSIS — E02 Subclinical iodine-deficiency hypothyroidism: Secondary | ICD-10-CM

## 2024-04-11 DIAGNOSIS — R7301 Impaired fasting glucose: Secondary | ICD-10-CM

## 2024-04-11 DIAGNOSIS — Z0001 Encounter for general adult medical examination with abnormal findings: Secondary | ICD-10-CM | POA: Diagnosis not present

## 2024-04-11 NOTE — Assessment & Plan Note (Signed)
Repeat TSH and T4 asymptomatic

## 2024-04-11 NOTE — Assessment & Plan Note (Signed)
 BP at goal BP Readings from Last 3 Encounters:  04/11/24 132/80  04/10/24 133/81  03/21/24 (!) 148/80    Maintain lisinopril  dose

## 2024-04-11 NOTE — Assessment & Plan Note (Signed)
repeat lipid panel

## 2024-04-11 NOTE — Progress Notes (Signed)
 Complete physical exam  Patient: Julie [name]   DOB: [birth year]   63 y.o. Female  MRN: [id number] Visit Date: 04/11/2024  Subjective:    Chief Complaint  Patient presents with   Annual Exam    No concerns   Korrie Hofbauer is a 63 y.o. female who presents today for a complete physical exam. She reports consuming a low fat and low sodium diet. Interval walking 4-5x/week She generally feels well. She reports sleeping well. She does have additional problems to discuss today.  Vision:Yes Dental:Yes STD Screen:No  BP Readings from Last 3 Encounters:  04/11/24 132/80  04/10/24 133/81  03/21/24 (!) 148/80   Wt Readings from Last 3 Encounters:  04/11/24 188 lb (85.3 kg)  03/21/24 188 lb 6.4 oz (85.5 kg)  03/01/24 189 lb 9.6 oz (86 kg)    Most recent fall risk assessment:    04/11/2024    1:19 PM  Fall Risk   Falls in the past year? 0  Number falls in past yr: 0  Injury with Fall? 0  Risk for fall due to : No Fall Risks     Depression screen:Yes - No Depression Most recent depression screenings:    04/11/2024    1:25 PM 03/30/2023    9:55 AM  PHQ 2/9 Scores  PHQ - 2 Score 0 0  PHQ- 9 Score 0 1      Data saved with a previous flowsheet row definition    HPI  Hypertension BP at goal BP Readings from Last 3 Encounters:  04/11/24 132/80  04/10/24 133/81  03/21/24 (!) 148/80    Maintain lisinopril  dose  Subclinical iodine-deficiency hypothyroidism Repeat TSH and T4 asymptomatic  Impaired fasting glucose repeat hgbA1c  Hyperlipidemia repeat lipid panel   Past Medical History:  Diagnosis Date   Allergy Early 2000s   Zithromax   Foot pain, bilateral    Hypercholesteremia    Hypertension    Positive ANA (antinuclear antibody) 03/09/2013   Prediabetes    Rheumatoid arthritis (HCC)    Past Surgical History:  Procedure Laterality Date   COLONOSCOPY  03/11/2012   (2) polyps   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    2025   Social  History   Socioeconomic History   Marital status: Married    Spouse name: Not on file   Number of children: Not on file   Years of education: Not on file   Highest education level: Bachelor's degree (e.g., BA, AB, BS)  Occupational History   Not on file  Tobacco Use   Smoking status: Never    Passive exposure: Never   Smokeless tobacco: Never  Vaping Use   Vaping status: Never Used  Substance and Sexual Activity   Alcohol use: Never   Drug use: Never   Sexual activity: Not Currently    Partners: Male    Birth control/protection: Post-menopausal  Other Topics Concern   Not on file  Social History Narrative   Not on file   Social Drivers of Health   Financial Resource Strain: Low Risk  (04/27/2023)   Overall Financial Resource Strain (CARDIA)    Difficulty of Paying Living Expenses: Not hard at all  Food Insecurity: No Food Insecurity (04/27/2023)   Hunger Vital Sign    Worried About Running Out of Food in the Last Year: Never true    Ran Out of Food in the Last Year: Never true  Transportation Needs: No Transportation Needs (04/27/2023)   PRAPARE - Transportation  Lack of Transportation (Medical): No    Lack of Transportation (Non-Medical): No  Physical Activity: Sufficiently Active (04/11/2024)   Exercise Vital Sign    Days of Exercise per Week: 5 days    Minutes of Exercise per Session: 30 min  Stress: Stress Concern Present (04/11/2024)   [employer] of Occupational Health - Occupational Stress Questionnaire    Feeling of Stress: To some extent  Social Connections: Unknown (04/11/2024)   Social Connection and Isolation Panel    Frequency of Communication with Friends and Family: Not on file    Frequency of Social Gatherings with Friends and Family: Not on file    Attends Religious Services: Not on file    Active Member of Clubs or Organizations: Yes    Attends Banker Meetings: Not on file    Marital Status: Not on file  Intimate Partner  Violence: Unknown (08/13/2021)   Received from [facility]   HITS    Physically Hurt: Not on file    Insult or Talk Down To: Not on file    Threaten Physical Harm: Not on file    Scream or Curse: Not on file   Family Status  Relation Name Status   Mother Jamee Deceased   Father Scientist, Physiological Deceased   Sister  Alive   Brother Doug Alive   Mat Uncle  Alive   MGM [place] Deceased   Cousin Maternal Cousin Alive  No partnership data on file   Family History  Problem Relation Age of Onset   Diabetes Mother    Hypertension Mother    Depression Mother    Asthma Mother    COPD Mother    Kidney disease Mother    Diabetes Father    Multiple myeloma Father    Hypertension Father    Thyroid  disease Sister    Hypertension Brother    Rheum arthritis Maternal Uncle    Rheum arthritis Maternal Grandmother    Arthritis Maternal Grandmother    Rheum arthritis Cousin    Allergies  Allergen Reactions   Zithromax [Azithromycin] Hives    Patient Care Team: Bryce Kimble, Roselie Rockford, NP as PCP - General (Internal Medicine) Jertson, Jill Evelyn, MD as Consulting Physician (Obstetrics and Gynecology)   Medications: Outpatient Medications Prior to Visit  Medication Sig   Alpha-Lipoic Acid 600 MG CAPS Take 600 mg by mouth 2 (two) times daily.   Ascorbic Acid (VITAMIN C PO) Take 2,000 mg by mouth 2 (two) times daily.   Barberry-Oreg Grape-Goldenseal (BERBERINE COMPLEX PO) Take by mouth.   Black Pepper-Turmeric (TURMERIC CURCUMIN) 09-998 MG CAPS Take 1,000 mg by mouth 2 (two) times daily.   Cholecalciferol (VITAMIN D PO) Take 10,000 Int'l Units by mouth daily.   Cyanocobalamin (VITAMIN B 12 PO) Take by mouth.   estradiol  (ESTRACE ) 0.1 MG/GM vaginal cream Place 1 g vaginally 2 (two) times a week. Place 0.5g nightly for two weeks then twice a week after   hydroxychloroquine  (PLAQUENIL ) 200 MG tablet Take 1 tablet (200 mg total) by mouth daily.   lactobacillus acidophilus (BACID) TABS tablet Take 1 tablet  by mouth daily.   lisinopril  (ZESTRIL ) 5 MG tablet TAKE 1 TABLET BY MOUTH DAILY   MAGNESIUM PO Take 400 mg by mouth. 5 days per week   Melatonin 5 MG CAPS Take by mouth.   Menaquinone-7 (K2 PO) Take by mouth.   MILK THISTLE PO Take 600 mg by mouth daily.   minocycline  (MINOCIN ) 50 MG capsule TAKE 2 CAPSULES BY MOUTH  TWICE A DAY ON MONDAY, WEDNESDAY AND FRIDAY   MISC NATURAL PRODUCTS PO Take by mouth. Holy Basil   Multiple Vitamins-Minerals (ZINC PO) Take by mouth.   Omega-3 Fatty Acids (OMEGA-3 FISH OIL PO) Take by mouth.   No facility-administered medications prior to visit.    Review of Systems  Constitutional:  Negative for activity change, appetite change and unexpected weight change.  Respiratory: Negative.    Cardiovascular: Negative.   Gastrointestinal: Negative.   Endocrine: Negative for cold intolerance and heat intolerance.  Genitourinary: Negative.   Musculoskeletal: Negative.   Skin: Negative.   Neurological: Negative.   Hematological: Negative.   Psychiatric/Behavioral:  Negative for behavioral problems, decreased concentration, dysphoric mood, hallucinations, self-injury, sleep disturbance and suicidal ideas. The patient is not nervous/anxious.         Objective:  BP 132/80   Pulse 81   Temp 98 F (36.7 C) (Temporal)   Ht 5' 4 (1.626 m)   Wt 188 lb (85.3 kg)   LMP 12/02/2012 (Approximate)   SpO2 99%   BMI 32.27 kg/m     Physical Exam Vitals and nursing note reviewed.  Constitutional:      General: She is not in acute distress. HENT:     Right Ear: Tympanic membrane, ear canal and external ear normal.     Left Ear: Tympanic membrane, ear canal and external ear normal.     Nose: Nose normal.  Eyes:     Extraocular Movements: Extraocular movements intact.     Conjunctiva/sclera: Conjunctivae normal.     Pupils: Pupils are equal, round, and reactive to light.  Neck:     Thyroid : No thyroid  mass, thyromegaly or thyroid  tenderness.  Cardiovascular:      Rate and Rhythm: Normal rate and regular rhythm.     Pulses: Normal pulses.     Heart sounds: Normal heart sounds.  Pulmonary:     Effort: Pulmonary effort is normal.     Breath sounds: Normal breath sounds.  Abdominal:     General: Bowel sounds are normal.     Palpations: Abdomen is soft.  Musculoskeletal:        General: Normal range of motion.     Cervical back: Normal range of motion and neck supple.     Right lower leg: No edema.     Left lower leg: No edema.  Lymphadenopathy:     Cervical: No cervical adenopathy.  Skin:    General: Skin is warm and dry.  Neurological:     Mental Status: She is alert and oriented to person, place, and time.     Cranial Nerves: No cranial nerve deficit.  Psychiatric:        Mood and Affect: Mood normal.        Behavior: Behavior normal.        Thought Content: Thought content normal.     No results found for any visits on 04/11/24.    Assessment & Plan:    Routine Health Maintenance and Physical Exam  Immunization History  Administered Date(s) Administered   DTaP 11/25/2010   Fluad Trivalent(High Dose 65+) 03/30/2023   INFLUENZA, HIGH DOSE SEASONAL PF 04/24/2011, 03/19/2012, 03/21/2024   Influenza Inj Mdck Quad Pf 02/14/2019   Influenza, Seasonal, Injecte, Preservative Fre 03/30/2013   Influenza,inj,Quad PF,6+ Mos 03/30/2013, 02/20/2022   Influenza-Unspecified 03/05/2021, 02/23/2022   PFIZER Comirnaty(Gray Top)Covid-19 Tri-Sucrose Vaccine 09/02/2020   PFIZER(Purple Top)SARS-COV-2 Vaccination 08/18/2019, 09/15/2019, 04/08/2020, 09/02/2020   Tdap 04/25/2021   Zoster Recombinant(Shingrix) 03/17/2022, 06/17/2022  Zoster, Unspecified 03/17/2022, 06/17/2022   Health Maintenance  Topic Date Due   Medicare Annual Wellness (AWV)  Never done   COVID-19 Vaccine (6 - 2025-26 season) 04/27/2024 (Originally 01/10/2024)   Pneumococcal Vaccine: 50+ Years (1 of 1 - PCV) 04/11/2025 (Originally 06/10/2008)   HIV Screening  04/11/2025 (Originally  06/10/1975)   Mammogram  07/10/2024   Cervical Cancer Screening (HPV/Pap Cotest)  03/21/2029   Colonoscopy  05/14/2030   DTaP/Tdap/Td (3 - Td or Tdap) 04/26/2031   Influenza Vaccine  Completed   Bone Density Scan  Completed   Hepatitis C Screening  Completed   Zoster Vaccines- Shingrix  Completed   Hepatitis B Vaccines 19-59 Average Risk  Aged Out   Meningococcal B Vaccine  Aged Out   Discussed health benefits of physical activity, and encouraged her to engage in regular exercise appropriate for her age and condition.  Problem List Items Addressed This Visit     Hyperlipidemia   repeat lipid panel      Relevant Orders   Lipid panel   Hypertension   BP at goal BP Readings from Last 3 Encounters:  04/11/24 132/80  04/10/24 133/81  03/21/24 (!) 148/80    Maintain lisinopril  dose      Impaired fasting glucose   repeat hgbA1c      Relevant Orders   Hemoglobin A1c   Subclinical iodine-deficiency hypothyroidism   Repeat TSH and T4 asymptomatic      Relevant Orders   TSH   T4, free   Other Visit Diagnoses       Encounter for preventative adult health care exam with abnormal findings    -  Primary   Relevant Orders   Comprehensive metabolic panel with GFR   CBC      Return in about 6 months (around 10/10/2024) for Hypothyroidism, HTN, hyperlipidemia (fasting).     Roselie Mood, NP

## 2024-04-11 NOTE — Patient Instructions (Signed)
 Schedule fasting lab appt. Need to be fasting 8hrs prior to blood draw. Ok to drink water and take BP meds. Maintain Heart healthy diet and daily exercise. Maintain current medications.

## 2024-04-11 NOTE — Assessment & Plan Note (Signed)
repeat hgbA1c °

## 2024-04-14 ENCOUNTER — Ambulatory Visit: Payer: Self-pay | Admitting: Nurse Practitioner

## 2024-04-14 ENCOUNTER — Other Ambulatory Visit

## 2024-04-14 DIAGNOSIS — E02 Subclinical iodine-deficiency hypothyroidism: Secondary | ICD-10-CM

## 2024-04-14 DIAGNOSIS — Z0001 Encounter for general adult medical examination with abnormal findings: Secondary | ICD-10-CM

## 2024-04-14 DIAGNOSIS — E782 Mixed hyperlipidemia: Secondary | ICD-10-CM

## 2024-04-14 DIAGNOSIS — R7301 Impaired fasting glucose: Secondary | ICD-10-CM

## 2024-04-14 LAB — CBC
HCT: 43.8 % (ref 36.0–46.0)
Hemoglobin: 14.4 g/dL (ref 12.0–15.0)
MCHC: 32.9 g/dL (ref 30.0–36.0)
MCV: 87.2 fl (ref 78.0–100.0)
Platelets: 224 K/uL (ref 150.0–400.0)
RBC: 5.02 Mil/uL (ref 3.87–5.11)
RDW: 14.3 % (ref 11.5–15.5)
WBC: 4.5 K/uL (ref 4.0–10.5)

## 2024-04-14 LAB — LIPID PANEL
Cholesterol: 213 mg/dL — ABNORMAL HIGH (ref 0–200)
HDL: 34.3 mg/dL — ABNORMAL LOW (ref >39.00)
LDL Cholesterol: 150 mg/dL — ABNORMAL HIGH (ref 0–99)
NonHDL: 178.87
Total CHOL/HDL Ratio: 6
Triglycerides: 145 mg/dL (ref 0.0–149.0)
VLDL: 29 mg/dL (ref 0.0–40.0)

## 2024-04-14 LAB — COMPREHENSIVE METABOLIC PANEL WITH GFR
ALT: 12 U/L (ref 0–35)
AST: 14 U/L (ref 0–37)
Albumin: 4.2 g/dL (ref 3.5–5.2)
Alkaline Phosphatase: 52 U/L (ref 39–117)
BUN: 23 mg/dL (ref 6–23)
CO2: 30 meq/L (ref 19–32)
Calcium: 8.7 mg/dL (ref 8.4–10.5)
Chloride: 101 meq/L (ref 96–112)
Creatinine, Ser: 0.72 mg/dL (ref 0.40–1.20)
GFR: 87.48 mL/min (ref >60.00)
Glucose, Bld: 104 mg/dL — ABNORMAL HIGH (ref 70–99)
Potassium: 4.2 meq/L (ref 3.5–5.1)
Sodium: 140 meq/L (ref 135–145)
Total Bilirubin: 0.4 mg/dL (ref 0.2–1.2)
Total Protein: 6.4 g/dL (ref 6.0–8.3)

## 2024-04-14 LAB — HEMOGLOBIN A1C: Hgb A1c MFr Bld: 5.6 % (ref 4.6–6.5)

## 2024-04-14 LAB — TSH: TSH: 2.88 u[IU]/mL (ref 0.35–5.50)

## 2024-04-14 LAB — T4, FREE: Free T4: 0.73 ng/dL (ref 0.60–1.60)

## 2024-04-14 NOTE — Addendum Note (Signed)
 Addended by: KATHEEN GARDEN L on: 04/14/2024 08:12 AM   Modules accepted: Orders

## 2024-04-18 LAB — HM MAMMOGRAPHY

## 2024-04-20 ENCOUNTER — Other Ambulatory Visit: Payer: Self-pay | Admitting: Internal Medicine

## 2024-04-20 DIAGNOSIS — M05731 Rheumatoid arthritis with rheumatoid factor of right wrist without organ or systems involvement: Secondary | ICD-10-CM

## 2024-05-02 ENCOUNTER — Encounter: Payer: Self-pay | Admitting: Obstetrics and Gynecology

## 2024-05-02 ENCOUNTER — Ambulatory Visit: Payer: Self-pay | Admitting: Obstetrics and Gynecology

## 2024-05-19 LAB — OPHTHALMOLOGY REPORT-SCANNED: A Comment: NORMAL

## 2024-08-29 ENCOUNTER — Ambulatory Visit: Admitting: Internal Medicine
